# Patient Record
Sex: Male | Born: 1935 | Race: Black or African American | Hispanic: No | Marital: Married | Smoking: Former smoker
Health system: Southern US, Community
[De-identification: ages and names within clinical notes are randomized; demographics above are authoritative.]

## PROBLEM LIST (undated history)

## (undated) DIAGNOSIS — E119 Type 2 diabetes mellitus without complications: Secondary | ICD-10-CM

## (undated) DIAGNOSIS — I10 Essential (primary) hypertension: Secondary | ICD-10-CM

## (undated) DIAGNOSIS — C801 Malignant (primary) neoplasm, unspecified: Secondary | ICD-10-CM

## (undated) DIAGNOSIS — Z87442 Personal history of urinary calculi: Secondary | ICD-10-CM

## (undated) HISTORY — PX: LITHOTRIPSY: SUR834

---

## 2002-06-06 ENCOUNTER — Encounter: Payer: Self-pay | Admitting: Urology

## 2002-06-06 ENCOUNTER — Ambulatory Visit (HOSPITAL_COMMUNITY): Admission: RE | Admit: 2002-06-06 | Discharge: 2002-06-06 | Payer: Self-pay | Admitting: Urology

## 2002-06-11 ENCOUNTER — Ambulatory Visit (HOSPITAL_COMMUNITY): Admission: RE | Admit: 2002-06-11 | Discharge: 2002-06-11 | Payer: Self-pay | Admitting: Urology

## 2002-06-11 ENCOUNTER — Encounter: Payer: Self-pay | Admitting: Urology

## 2003-09-25 ENCOUNTER — Ambulatory Visit (HOSPITAL_COMMUNITY): Admission: RE | Admit: 2003-09-25 | Discharge: 2003-09-25 | Payer: Self-pay

## 2003-10-03 ENCOUNTER — Ambulatory Visit (HOSPITAL_COMMUNITY): Admission: RE | Admit: 2003-10-03 | Discharge: 2003-10-03 | Payer: Self-pay | Admitting: Urology

## 2005-01-19 ENCOUNTER — Ambulatory Visit (HOSPITAL_COMMUNITY): Admission: RE | Admit: 2005-01-19 | Discharge: 2005-01-19 | Payer: Self-pay | Admitting: Internal Medicine

## 2011-06-22 ENCOUNTER — Ambulatory Visit (INDEPENDENT_AMBULATORY_CARE_PROVIDER_SITE_OTHER): Payer: Medicare Other | Admitting: Urology

## 2011-06-22 DIAGNOSIS — N529 Male erectile dysfunction, unspecified: Secondary | ICD-10-CM

## 2011-06-22 DIAGNOSIS — N200 Calculus of kidney: Secondary | ICD-10-CM

## 2011-06-22 DIAGNOSIS — C61 Malignant neoplasm of prostate: Secondary | ICD-10-CM

## 2011-06-22 DIAGNOSIS — N400 Enlarged prostate without lower urinary tract symptoms: Secondary | ICD-10-CM

## 2011-10-15 ENCOUNTER — Other Ambulatory Visit: Payer: Self-pay | Admitting: Urology

## 2011-10-15 ENCOUNTER — Ambulatory Visit (HOSPITAL_COMMUNITY)
Admission: RE | Admit: 2011-10-15 | Discharge: 2011-10-15 | Disposition: A | Discharge status: Home / Self Care | Payer: Medicare Other | Source: Ambulatory Visit | Attending: Urology | Admitting: Urology

## 2011-10-15 DIAGNOSIS — N200 Calculus of kidney: Secondary | ICD-10-CM

## 2011-10-15 DIAGNOSIS — R1032 Left lower quadrant pain: Secondary | ICD-10-CM | POA: Insufficient documentation

## 2011-10-19 ENCOUNTER — Ambulatory Visit (INDEPENDENT_AMBULATORY_CARE_PROVIDER_SITE_OTHER): Payer: Medicare Other | Admitting: Urology

## 2011-10-19 DIAGNOSIS — N200 Calculus of kidney: Secondary | ICD-10-CM

## 2011-10-19 DIAGNOSIS — N529 Male erectile dysfunction, unspecified: Secondary | ICD-10-CM

## 2011-10-19 DIAGNOSIS — C61 Malignant neoplasm of prostate: Secondary | ICD-10-CM

## 2012-04-04 ENCOUNTER — Ambulatory Visit (INDEPENDENT_AMBULATORY_CARE_PROVIDER_SITE_OTHER): Payer: Medicare Other | Admitting: Urology

## 2012-04-04 DIAGNOSIS — C61 Malignant neoplasm of prostate: Secondary | ICD-10-CM

## 2012-06-20 ENCOUNTER — Ambulatory Visit (HOSPITAL_COMMUNITY)
Admission: RE | Admit: 2012-06-20 | Discharge: 2012-06-20 | Disposition: A | Discharge status: Home / Self Care | Payer: Medicare Other | Source: Ambulatory Visit | Attending: Urology | Admitting: Urology

## 2012-06-20 ENCOUNTER — Ambulatory Visit (INDEPENDENT_AMBULATORY_CARE_PROVIDER_SITE_OTHER): Payer: Medicare Other | Admitting: Urology

## 2012-06-20 ENCOUNTER — Other Ambulatory Visit: Payer: Self-pay | Admitting: Urology

## 2012-06-20 DIAGNOSIS — R109 Unspecified abdominal pain: Secondary | ICD-10-CM | POA: Insufficient documentation

## 2012-06-20 DIAGNOSIS — N200 Calculus of kidney: Secondary | ICD-10-CM | POA: Insufficient documentation

## 2012-06-20 DIAGNOSIS — R319 Hematuria, unspecified: Secondary | ICD-10-CM

## 2012-06-20 DIAGNOSIS — C61 Malignant neoplasm of prostate: Secondary | ICD-10-CM

## 2012-07-11 ENCOUNTER — Other Ambulatory Visit: Payer: Self-pay | Admitting: Urology

## 2012-08-01 ENCOUNTER — Ambulatory Visit: Payer: Medicare Other | Admitting: Urology

## 2012-10-10 ENCOUNTER — Other Ambulatory Visit: Payer: Self-pay | Admitting: Urology

## 2012-10-10 ENCOUNTER — Ambulatory Visit (HOSPITAL_COMMUNITY)
Admission: RE | Admit: 2012-10-10 | Discharge: 2012-10-10 | Disposition: A | Discharge status: Home / Self Care | Payer: Medicare Other | Source: Ambulatory Visit | Attending: Urology | Admitting: Urology

## 2012-10-10 DIAGNOSIS — C61 Malignant neoplasm of prostate: Secondary | ICD-10-CM

## 2012-10-10 MED ORDER — GENTAMICIN SULFATE 40 MG/ML IJ SOLN
160.0000 mg | Freq: Once | INTRAMUSCULAR | Status: AC
  Administered 2012-10-10: 160 mg via INTRAMUSCULAR
  Filled 2012-10-10: qty 4

## 2012-10-10 MED ORDER — LIDOCAINE HCL (PF) 2 % IJ SOLN
INTRAMUSCULAR | Status: AC
  Administered 2012-10-10: 10 mL via INTRADERMAL
  Filled 2012-10-10: qty 10

## 2012-10-10 NOTE — Progress Notes (Signed)
Gentamicin 160mg /29mL given IM in left upper arm as ordered by Dr. Retta Diones per protocol. Bandaid applied. No bleeding noted.

## 2012-10-10 NOTE — Progress Notes (Signed)
Lidocaine 2%       10mL injected                       Transrectal prostate biopsies performed 

## 2012-10-16 ENCOUNTER — Encounter: Payer: Self-pay | Admitting: Urology

## 2012-10-17 ENCOUNTER — Ambulatory Visit: Payer: Medicare Other | Admitting: Urology

## 2013-03-20 ENCOUNTER — Encounter (INDEPENDENT_AMBULATORY_CARE_PROVIDER_SITE_OTHER): Payer: Self-pay

## 2013-03-20 ENCOUNTER — Ambulatory Visit (INDEPENDENT_AMBULATORY_CARE_PROVIDER_SITE_OTHER): Payer: Medicare Other | Admitting: Urology

## 2013-03-20 DIAGNOSIS — C61 Malignant neoplasm of prostate: Secondary | ICD-10-CM

## 2013-03-20 DIAGNOSIS — N529 Male erectile dysfunction, unspecified: Secondary | ICD-10-CM

## 2013-09-18 ENCOUNTER — Ambulatory Visit (INDEPENDENT_AMBULATORY_CARE_PROVIDER_SITE_OTHER): Payer: Medicare Other | Admitting: Urology

## 2013-09-18 DIAGNOSIS — N529 Male erectile dysfunction, unspecified: Secondary | ICD-10-CM

## 2013-09-18 DIAGNOSIS — C61 Malignant neoplasm of prostate: Secondary | ICD-10-CM

## 2013-09-18 DIAGNOSIS — N400 Enlarged prostate without lower urinary tract symptoms: Secondary | ICD-10-CM

## 2014-03-19 ENCOUNTER — Ambulatory Visit (INDEPENDENT_AMBULATORY_CARE_PROVIDER_SITE_OTHER): Payer: Medicare Other | Admitting: Urology

## 2014-03-19 DIAGNOSIS — N400 Enlarged prostate without lower urinary tract symptoms: Secondary | ICD-10-CM

## 2014-03-19 DIAGNOSIS — N5201 Erectile dysfunction due to arterial insufficiency: Secondary | ICD-10-CM

## 2014-03-19 DIAGNOSIS — C61 Malignant neoplasm of prostate: Secondary | ICD-10-CM

## 2014-10-15 ENCOUNTER — Ambulatory Visit (INDEPENDENT_AMBULATORY_CARE_PROVIDER_SITE_OTHER): Payer: Medicare Other | Admitting: Urology

## 2014-10-15 DIAGNOSIS — C61 Malignant neoplasm of prostate: Secondary | ICD-10-CM | POA: Diagnosis not present

## 2015-04-08 ENCOUNTER — Ambulatory Visit (INDEPENDENT_AMBULATORY_CARE_PROVIDER_SITE_OTHER): Payer: Medicare Other | Admitting: Urology

## 2015-04-08 DIAGNOSIS — N5201 Erectile dysfunction due to arterial insufficiency: Secondary | ICD-10-CM | POA: Diagnosis not present

## 2015-04-08 DIAGNOSIS — C61 Malignant neoplasm of prostate: Secondary | ICD-10-CM | POA: Diagnosis not present

## 2015-04-08 DIAGNOSIS — N400 Enlarged prostate without lower urinary tract symptoms: Secondary | ICD-10-CM | POA: Diagnosis not present

## 2015-04-15 ENCOUNTER — Other Ambulatory Visit: Payer: Self-pay | Admitting: Urology

## 2015-04-15 ENCOUNTER — Ambulatory Visit (HOSPITAL_COMMUNITY)
Admission: RE | Admit: 2015-04-15 | Discharge: 2015-04-15 | Disposition: A | Discharge status: Home / Self Care | Payer: Medicare Other | Source: Ambulatory Visit | Attending: Urology | Admitting: Urology

## 2015-04-15 DIAGNOSIS — K5900 Constipation, unspecified: Secondary | ICD-10-CM | POA: Diagnosis not present

## 2015-04-15 DIAGNOSIS — N200 Calculus of kidney: Secondary | ICD-10-CM | POA: Diagnosis not present

## 2015-10-07 ENCOUNTER — Ambulatory Visit (INDEPENDENT_AMBULATORY_CARE_PROVIDER_SITE_OTHER): Payer: Medicare Other | Admitting: Urology

## 2015-10-07 DIAGNOSIS — C61 Malignant neoplasm of prostate: Secondary | ICD-10-CM

## 2015-10-07 DIAGNOSIS — R310 Gross hematuria: Secondary | ICD-10-CM | POA: Diagnosis not present

## 2015-10-08 ENCOUNTER — Other Ambulatory Visit: Payer: Self-pay | Admitting: Urology

## 2015-10-08 DIAGNOSIS — R310 Gross hematuria: Secondary | ICD-10-CM

## 2015-10-24 ENCOUNTER — Ambulatory Visit (HOSPITAL_COMMUNITY)
Admission: RE | Admit: 2015-10-24 | Discharge: 2015-10-24 | Disposition: A | Discharge status: Home / Self Care | Payer: Medicare Other | Source: Ambulatory Visit | Attending: Urology | Admitting: Urology

## 2015-10-24 DIAGNOSIS — N200 Calculus of kidney: Secondary | ICD-10-CM | POA: Diagnosis not present

## 2015-10-24 DIAGNOSIS — N281 Cyst of kidney, acquired: Secondary | ICD-10-CM | POA: Diagnosis not present

## 2015-10-24 DIAGNOSIS — N400 Enlarged prostate without lower urinary tract symptoms: Secondary | ICD-10-CM | POA: Diagnosis not present

## 2015-10-24 DIAGNOSIS — R310 Gross hematuria: Secondary | ICD-10-CM

## 2015-10-24 DIAGNOSIS — C61 Malignant neoplasm of prostate: Secondary | ICD-10-CM | POA: Insufficient documentation

## 2016-02-03 ENCOUNTER — Ambulatory Visit (INDEPENDENT_AMBULATORY_CARE_PROVIDER_SITE_OTHER): Payer: Medicare Other | Admitting: Urology

## 2016-02-03 DIAGNOSIS — C61 Malignant neoplasm of prostate: Secondary | ICD-10-CM | POA: Diagnosis not present

## 2016-02-03 DIAGNOSIS — N201 Calculus of ureter: Secondary | ICD-10-CM | POA: Diagnosis not present

## 2016-04-13 ENCOUNTER — Ambulatory Visit (INDEPENDENT_AMBULATORY_CARE_PROVIDER_SITE_OTHER): Payer: Medicare Other | Admitting: Urology

## 2016-04-13 ENCOUNTER — Other Ambulatory Visit (HOSPITAL_COMMUNITY)
Admission: RE | Admit: 2016-04-13 | Discharge: 2016-04-13 | Disposition: A | Discharge status: Home / Self Care | Payer: Medicare Other | Source: Ambulatory Visit | Attending: Urology | Admitting: Urology

## 2016-04-13 DIAGNOSIS — N3001 Acute cystitis with hematuria: Secondary | ICD-10-CM | POA: Diagnosis not present

## 2016-04-13 DIAGNOSIS — R310 Gross hematuria: Secondary | ICD-10-CM | POA: Diagnosis not present

## 2016-04-18 LAB — URINE CULTURE: Culture: >=100000 — AB

## 2016-06-01 ENCOUNTER — Other Ambulatory Visit: Payer: Self-pay | Admitting: Urology

## 2016-06-01 ENCOUNTER — Ambulatory Visit (HOSPITAL_COMMUNITY)
Admission: RE | Admit: 2016-06-01 | Discharge: 2016-06-01 | Disposition: A | Discharge status: Home / Self Care | Payer: Medicare Other | Source: Ambulatory Visit | Attending: Urology | Admitting: Urology

## 2016-06-01 ENCOUNTER — Other Ambulatory Visit (HOSPITAL_COMMUNITY)
Admission: RE | Admit: 2016-06-01 | Discharge: 2016-06-01 | Disposition: A | Discharge status: Home / Self Care | Payer: Medicare Other | Attending: Urology | Admitting: Urology

## 2016-06-01 ENCOUNTER — Ambulatory Visit (INDEPENDENT_AMBULATORY_CARE_PROVIDER_SITE_OTHER): Payer: Medicare Other | Admitting: Urology

## 2016-06-01 DIAGNOSIS — N200 Calculus of kidney: Secondary | ICD-10-CM | POA: Diagnosis not present

## 2016-06-01 DIAGNOSIS — C61 Malignant neoplasm of prostate: Secondary | ICD-10-CM

## 2016-06-03 LAB — URINE CULTURE: Culture: <10000 — AB

## 2016-10-01 ENCOUNTER — Ambulatory Visit (INDEPENDENT_AMBULATORY_CARE_PROVIDER_SITE_OTHER): Payer: Medicare Other | Admitting: Urology

## 2016-10-01 ENCOUNTER — Other Ambulatory Visit (HOSPITAL_COMMUNITY)
Admission: RE | Admit: 2016-10-01 | Discharge: 2016-10-01 | Disposition: A | Discharge status: Home / Self Care | Payer: Medicare Other | Source: Other Acute Inpatient Hospital | Attending: Urology | Admitting: Urology

## 2016-10-01 DIAGNOSIS — N200 Calculus of kidney: Secondary | ICD-10-CM | POA: Diagnosis not present

## 2016-10-01 DIAGNOSIS — R310 Gross hematuria: Secondary | ICD-10-CM | POA: Diagnosis present

## 2016-10-01 DIAGNOSIS — N3001 Acute cystitis with hematuria: Secondary | ICD-10-CM

## 2016-10-01 LAB — URINALYSIS, ROUTINE W REFLEX MICROSCOPIC

## 2016-10-01 LAB — URINALYSIS, MICROSCOPIC (REFLEX)

## 2016-10-05 LAB — URINE CULTURE: Culture: 50000 — AB

## 2016-11-30 ENCOUNTER — Other Ambulatory Visit (HOSPITAL_COMMUNITY)
Admission: AD | Admit: 2016-11-30 | Discharge: 2016-11-30 | Disposition: A | Discharge status: Home / Self Care | Payer: Medicare Other | Source: Skilled Nursing Facility | Attending: Urology | Admitting: Urology

## 2016-11-30 ENCOUNTER — Ambulatory Visit (INDEPENDENT_AMBULATORY_CARE_PROVIDER_SITE_OTHER): Payer: Medicare Other | Admitting: Urology

## 2016-11-30 ENCOUNTER — Ambulatory Visit (HOSPITAL_COMMUNITY)
Admission: RE | Admit: 2016-11-30 | Discharge: 2016-11-30 | Disposition: A | Discharge status: Home / Self Care | Payer: Medicare Other | Source: Ambulatory Visit | Attending: Urology | Admitting: Urology

## 2016-11-30 ENCOUNTER — Other Ambulatory Visit: Payer: Self-pay | Admitting: Urology

## 2016-11-30 DIAGNOSIS — N200 Calculus of kidney: Secondary | ICD-10-CM

## 2016-11-30 DIAGNOSIS — N5201 Erectile dysfunction due to arterial insufficiency: Secondary | ICD-10-CM

## 2016-11-30 DIAGNOSIS — C61 Malignant neoplasm of prostate: Secondary | ICD-10-CM

## 2016-11-30 DIAGNOSIS — N400 Enlarged prostate without lower urinary tract symptoms: Secondary | ICD-10-CM

## 2016-12-02 LAB — URINE CULTURE

## 2017-02-14 NOTE — Patient Instructions (Addendum)
Your procedure is scheduled on: 02/21/2017  Report to [facility] at  53  AM.  Call this number if you have problems the morning of surgery: [phone]   Do not eat food or drink liquids :After Midnight.      Take these medicines the morning of surgery with A SIP OF WATER: losartan and procardia. DO NOT take any medications for diabetes the morning of surgery.   Do not wear jewelry, make-up or nail polish.  Do not wear lotions, powders, or perfumes. You may wear deodorant.  Do not shave 48 hours prior to surgery.  Do not bring valuables to the hospital.  Contacts, dentures or bridgework may not be worn into surgery.  Leave suitcase in the car. After surgery it may be brought to your room.  For patients admitted to the hospital, checkout time is 11:00 AM the day of discharge.   Patients discharged the day of surgery will not be allowed to drive home.  :     Please read over the following fact sheets that you were given: Coughing and Deep Breathing, Surgical Site Infection Prevention, Anesthesia Post-op Instructions and Care and Recovery After Surgery    Cataract A cataract is a clouding of the lens of the eye. When a lens becomes cloudy, vision is reduced based on the degree and nature of the clouding. Many cataracts reduce vision to some degree. Some cataracts make people more near-sighted as they develop. Other cataracts increase glare. Cataracts that are ignored and become worse can sometimes look white. The white color can be seen through the pupil. CAUSES   Aging. However, cataracts may occur at any age, even in newborns.   Certain drugs.   Trauma to the eye.   Certain diseases such as diabetes.   Specific eye diseases such as chronic inflammation inside the eye or a sudden attack of a rare form of glaucoma.   Inherited or acquired medical problems.  SYMPTOMS   Gradual, progressive drop in vision in the affected eye.   Severe, rapid visual loss. This most often happens  when trauma is the cause.  DIAGNOSIS  To detect a cataract, an eye doctor examines the lens. Cataracts are best diagnosed with an exam of the eyes with the pupils enlarged (dilated) by drops.  TREATMENT  For an early cataract, vision may improve by using different eyeglasses or stronger lighting. If that does not help your vision, surgery is the only effective treatment. A cataract needs to be surgically removed when vision loss interferes with your everyday activities, such as driving, reading, or watching TV. A cataract may also have to be removed if it prevents examination or treatment of another eye problem. Surgery removes the cloudy lens and usually replaces it with a substitute lens (intraocular lens, IOL).  At a time when both you and your doctor agree, the cataract will be surgically removed. If you have cataracts in both eyes, only one is usually removed at a time. This allows the operated eye to heal and be out of danger from any possible problems after surgery (such as infection or poor wound healing). In rare cases, a cataract may be doing damage to your eye. In these cases, your caregiver may advise surgical removal right away. The vast majority of people who have cataract surgery have better vision afterward. HOME CARE INSTRUCTIONS  If you are not planning surgery, you may be asked to do the following:  Use different eyeglasses.   Use stronger or brighter  lighting.   Ask your eye doctor about reducing your medicine dose or changing medicines if it is thought that a medicine caused your cataract. Changing medicines does not make the cataract go away on its own.   Become familiar with your surroundings. Poor vision can lead to injury. Avoid bumping into things on the affected side. You are at a higher risk for tripping or falling.   Exercise extreme care when driving or operating machinery.   Wear sunglasses if you are sensitive to bright light or experiencing problems with glare.    SEEK IMMEDIATE MEDICAL CARE IF:   You have a worsening or sudden vision loss.   You notice redness, swelling, or increasing pain in the eye.   You have a fever.  Document Released: 05/03/2005 Document Revised: 04/22/2011 Document Reviewed: 12/25/2010 [facility] Patient Information 2012 [place].PATIENT INSTRUCTIONS POST-ANESTHESIA  IMMEDIATELY FOLLOWING SURGERY:  Do not drive or operate machinery for the first twenty four hours after surgery.  Do not make any important decisions for twenty four hours after surgery or while taking narcotic pain medications or sedatives.  If you develop intractable nausea and vomiting or a severe headache please notify your doctor immediately.  FOLLOW-UP:  Please make an appointment with your surgeon as instructed. You do not need to follow up with anesthesia unless specifically instructed to do so.  WOUND CARE INSTRUCTIONS (if applicable):  Keep a dry clean dressing on the anesthesia/puncture wound site if there is drainage.  Once the wound has quit draining you may leave it open to air.  Generally you should leave the bandage intact for twenty four hours unless there is drainage.  If the epidural site drains for more than 36-48 hours please call the anesthesia department.  QUESTIONS?:  Please feel free to call your physician or the hospital operator if you have any questions, and they will be happy to assist you.

## 2017-02-16 ENCOUNTER — Encounter (HOSPITAL_COMMUNITY)
Admission: RE | Admit: 2017-02-16 | Discharge: 2017-02-16 | Disposition: A | Discharge status: Home / Self Care | Payer: Medicare Other | Source: Ambulatory Visit | Attending: Ophthalmology | Admitting: Ophthalmology

## 2017-02-16 ENCOUNTER — Encounter (HOSPITAL_COMMUNITY): Payer: Self-pay

## 2017-02-16 DIAGNOSIS — Z01812 Encounter for preprocedural laboratory examination: Secondary | ICD-10-CM | POA: Diagnosis present

## 2017-02-16 DIAGNOSIS — Z01810 Encounter for preprocedural cardiovascular examination: Secondary | ICD-10-CM | POA: Diagnosis present

## 2017-02-16 HISTORY — DX: Essential (primary) hypertension: I10

## 2017-02-16 HISTORY — DX: Personal history of urinary calculi: Z87.442

## 2017-02-16 HISTORY — DX: Malignant (primary) neoplasm, unspecified: C80.1

## 2017-02-16 HISTORY — DX: Type 2 diabetes mellitus without complications: E11.9

## 2017-02-16 LAB — BASIC METABOLIC PANEL
Anion gap: 7 (ref 5–15)
BUN: 13 mg/dL (ref 6–20)
CO2: 26 mmol/L (ref 22–32)
CREATININE: 0.91 mg/dL (ref 0.61–1.24)
Calcium: 9.1 mg/dL (ref 8.9–10.3)
Chloride: 102 mmol/L (ref 101–111)
GFR calc Af Amer: >60 mL/min (ref >60)
GLUCOSE: 212 mg/dL — AB (ref 65–99)
POTASSIUM: 3.9 mmol/L (ref 3.5–5.1)
Sodium: 135 mmol/L (ref 135–145)

## 2017-02-16 LAB — CBC WITH DIFFERENTIAL/PLATELET
Basophils Absolute: 0 10*3/uL (ref 0.0–0.1)
Basophils Relative: 1 %
EOS ABS: 0.2 10*3/uL (ref 0.0–0.7)
EOS PCT: 3 %
HCT: 38.5 % — ABNORMAL LOW (ref 39.0–52.0)
Hemoglobin: 13.3 g/dL (ref 13.0–17.0)
LYMPHS ABS: 1.7 10*3/uL (ref 0.7–4.0)
LYMPHS PCT: 27 %
MCH: 25.2 pg — AB (ref 26.0–34.0)
MCHC: 34.5 g/dL (ref 30.0–36.0)
MCV: 73.1 fL — AB (ref 78.0–100.0)
MONO ABS: 0.5 10*3/uL (ref 0.1–1.0)
MONOS PCT: 8 %
Neutro Abs: 3.7 10*3/uL (ref 1.7–7.7)
Neutrophils Relative %: 61 %
PLATELETS: 238 10*3/uL (ref 150–400)
RBC: 5.27 MIL/uL (ref 4.22–5.81)
RDW: 15.4 % (ref 11.5–15.5)
WBC: 6 10*3/uL (ref 4.0–10.5)

## 2017-02-21 ENCOUNTER — Encounter (HOSPITAL_COMMUNITY): Payer: Self-pay

## 2017-02-21 ENCOUNTER — Ambulatory Visit (HOSPITAL_COMMUNITY): Payer: Medicare Other | Admitting: Anesthesiology

## 2017-02-21 ENCOUNTER — Ambulatory Visit (HOSPITAL_COMMUNITY)
Admission: RE | Admit: 2017-02-21 | Discharge: 2017-02-21 | Disposition: A | Discharge status: Home / Self Care | Payer: Medicare Other | Source: Ambulatory Visit | Attending: Ophthalmology | Admitting: Ophthalmology

## 2017-02-21 ENCOUNTER — Encounter (HOSPITAL_COMMUNITY)
Admission: RE | Disposition: A | Discharge status: Home / Self Care | Payer: Self-pay | Source: Ambulatory Visit | Attending: Ophthalmology

## 2017-02-21 DIAGNOSIS — H2511 Age-related nuclear cataract, right eye: Secondary | ICD-10-CM | POA: Diagnosis not present

## 2017-02-21 DIAGNOSIS — Z7984 Long term (current) use of oral hypoglycemic drugs: Secondary | ICD-10-CM | POA: Insufficient documentation

## 2017-02-21 DIAGNOSIS — Z7982 Long term (current) use of aspirin: Secondary | ICD-10-CM | POA: Diagnosis not present

## 2017-02-21 DIAGNOSIS — Z87891 Personal history of nicotine dependence: Secondary | ICD-10-CM | POA: Insufficient documentation

## 2017-02-21 DIAGNOSIS — I10 Essential (primary) hypertension: Secondary | ICD-10-CM | POA: Insufficient documentation

## 2017-02-21 DIAGNOSIS — Z79899 Other long term (current) drug therapy: Secondary | ICD-10-CM | POA: Insufficient documentation

## 2017-02-21 DIAGNOSIS — E119 Type 2 diabetes mellitus without complications: Secondary | ICD-10-CM | POA: Insufficient documentation

## 2017-02-21 HISTORY — PX: CATARACT EXTRACTION W/PHACO: SHX586

## 2017-02-21 LAB — GLUCOSE, CAPILLARY: Glucose-Capillary: 117 mg/dL — ABNORMAL HIGH (ref 65–99)

## 2017-02-21 SURGERY — PHACOEMULSIFICATION, CATARACT, WITH IOL INSERTION
Anesthesia: Monitor Anesthesia Care | Site: Eye | Laterality: Right

## 2017-02-21 MED ORDER — NEOMYCIN-POLYMYXIN-DEXAMETH 3.5-10000-0.1 OP SUSP
OPHTHALMIC | Status: DC | PRN
  Administered 2017-02-21: 2 [drp] via OPHTHALMIC

## 2017-02-21 MED ORDER — BSS IO SOLN
INTRAOCULAR | Status: DC | PRN
  Administered 2017-02-21: 15 mL

## 2017-02-21 MED ORDER — FENTANYL CITRATE (PF) 100 MCG/2ML IJ SOLN
25.0000 ug | Freq: Once | INTRAMUSCULAR | Status: AC
  Administered 2017-02-21: 25 ug via INTRAVENOUS

## 2017-02-21 MED ORDER — LIDOCAINE HCL (PF) 1 % IJ SOLN
INTRAMUSCULAR | Status: DC | PRN
  Administered 2017-02-21: .5 mL

## 2017-02-21 MED ORDER — CYCLOPENTOLATE-PHENYLEPHRINE 0.2-1 % OP SOLN
1.0000 [drp] | OPHTHALMIC | Status: AC
  Administered 2017-02-21 (×3): 1 [drp] via OPHTHALMIC

## 2017-02-21 MED ORDER — LIDOCAINE HCL 3.5 % OP GEL
1.0000 "application " | Freq: Once | OPHTHALMIC | Status: AC
  Administered 2017-02-21: 1 via OPHTHALMIC

## 2017-02-21 MED ORDER — FENTANYL CITRATE (PF) 100 MCG/2ML IJ SOLN
INTRAMUSCULAR | Status: AC
  Filled 2017-02-21: qty 2

## 2017-02-21 MED ORDER — MIDAZOLAM HCL 2 MG/2ML IJ SOLN
INTRAMUSCULAR | Status: AC
  Filled 2017-02-21: qty 2

## 2017-02-21 MED ORDER — BSS IO SOLN
INTRAOCULAR | Status: DC | PRN
  Administered 2017-02-21: 500 mL

## 2017-02-21 MED ORDER — LACTATED RINGERS IV SOLN
INTRAVENOUS | Status: DC
  Administered 2017-02-21: 07:00:00 via INTRAVENOUS

## 2017-02-21 MED ORDER — PROVISC 10 MG/ML IO SOLN
INTRAOCULAR | Status: DC | PRN
Start: 2017-02-21 — End: 2017-02-21
  Administered 2017-02-21: 0.85 mL via INTRAOCULAR

## 2017-02-21 MED ORDER — MIDAZOLAM HCL 2 MG/2ML IJ SOLN
1.0000 mg | INTRAMUSCULAR | Status: AC
  Administered 2017-02-21: 2 mg via INTRAVENOUS

## 2017-02-21 MED ORDER — POVIDONE-IODINE 5 % OP SOLN
OPHTHALMIC | Status: DC | PRN
  Administered 2017-02-21: 1 via OPHTHALMIC

## 2017-02-21 MED ORDER — TETRACAINE HCL 0.5 % OP SOLN
1.0000 [drp] | OPHTHALMIC | Status: AC
  Administered 2017-02-21 (×3): 1 [drp] via OPHTHALMIC

## 2017-02-21 MED ORDER — PHENYLEPHRINE HCL 2.5 % OP SOLN
1.0000 [drp] | OPHTHALMIC | Status: AC
  Administered 2017-02-21 (×3): 1 [drp] via OPHTHALMIC

## 2017-02-21 SURGICAL SUPPLY — 12 items
CLOTH BEACON ORANGE TIMEOUT ST (SAFETY) ×3 IMPLANT
EYE SHIELD UNIVERSAL CLEAR (GAUZE/BANDAGES/DRESSINGS) ×3 IMPLANT
GLOVE BIOGEL PI IND STRL 6.5 (GLOVE) ×1 IMPLANT
GLOVE BIOGEL PI IND STRL 7.0 (GLOVE) ×1 IMPLANT
GLOVE BIOGEL PI INDICATOR 6.5 (GLOVE) ×2
GLOVE BIOGEL PI INDICATOR 7.0 (GLOVE) ×2
LENS ALC ACRYL/TECN (Ophthalmic Related) ×3 IMPLANT
PAD ARMBOARD 7.5X6 YLW CONV (MISCELLANEOUS) ×3 IMPLANT
SYRINGE LUER LOK 1CC (MISCELLANEOUS) ×3 IMPLANT
TAPE SURG TRANSPORE 1 IN (GAUZE/BANDAGES/DRESSINGS) ×1 IMPLANT
TAPE SURGICAL TRANSPORE 1 IN (GAUZE/BANDAGES/DRESSINGS) ×2
WATER STERILE IRR 250ML POUR (IV SOLUTION) ×3 IMPLANT

## 2017-02-21 NOTE — Op Note (Signed)
Date of Admission: 02/21/2017  Date of Surgery: 02/21/2017  Pre-Op Dx: Cataract Right  Eye  Post-Op Dx: Senile Nuclear Cataract  Right  Eye,  Dx Code H25.11  Surgeon: Tonny Branch, M.D.  Assistants: None  Anesthesia: Topical with MAC  Indications: Painless, progressive loss of vision with compromise of daily activities.  Surgery: Cataract Extraction with Intraocular lens Implant Right Eye  Discription: The patient had dilating drops and viscous lidocaine placed into the Right eye in the pre-op holding area. After transfer to the operating room, a time out was performed. The patient was then prepped and draped. Beginning with a 20m blade a paracentesis port was made at the surgeon's 2 o'clock position. The anterior chamber was then filled with 1% non-preserved lidocaine. This was followed by filling the anterior chamber with Provisc.  A 2.429mkeratome blade was used to make a clear corneal incision at the temporal limbus.  A bent cystatome needle was used to create a continuous tear capsulotomy. Hydrodissection was performed with balanced salt solution on a Fine canula. The lens nucleus was then removed using the phacoemulsification handpiece. Residual cortex was removed with the I&A handpiece. The anterior chamber and capsular bag were refilled with Provisc. A posterior chamber intraocular lens was placed into the capsular bag with it's injector. The implant was positioned with the Kuglan hook. The Provisc was then removed from the anterior chamber and capsular bag with the I&A handpiece. Stromal hydration of the main incision and paracentesis port was performed with BSS on a Fine canula. The wounds were tested for leak which was negative. The patient tolerated the procedure well. There were no operative complications. The patient was then transferred to the recovery room in stable condition.  Complications: None  Specimen: None  EBL: None  Prosthetic device: Abbott Technis, PCB00, power 19.5,  SN [id number]

## 2017-02-21 NOTE — Transfer of Care (Signed)
Immediate Anesthesia Transfer of Care Note  Patient: Alexander Frank.  Procedure(s) Performed: CATARACT EXTRACTION PHACO AND INTRAOCULAR LENS PLACEMENT (IOC) (Right Eye)  Patient Location: PACU  Anesthesia Type:MAC  Level of Consciousness: awake, alert , oriented and patient cooperative  Airway & Oxygen Therapy: Patient Spontanous Breathing  Post-op Assessment: Report given to RN and Post -op Vital signs reviewed and stable  Post vital signs: Reviewed and stable  Last Vitals:  Vitals:   02/21/17 0710 02/21/17 0715  BP: (!) 142/74 121/69  Resp: 15 13  Temp:    SpO2: 94% 98%    Last Pain:  Vitals:   02/21/17 0640  TempSrc: Oral         Complications: No apparent anesthesia complications

## 2017-02-21 NOTE — Anesthesia Preprocedure Evaluation (Signed)
Anesthesia Evaluation  Patient identified by MRN, date of birth, ID band Patient awake    Reviewed: Allergy & Precautions, NPO status , Patient's Chart, lab work & pertinent test results  Airway Mallampati: III  TM Distance: >3 FB Neck ROM: Full    Dental  (+) Poor Dentition, Missing   Pulmonary former smoker,    breath sounds clear to auscultation       Cardiovascular hypertension, Pt. on medications  Rhythm:Regular Rate:Normal     Neuro/Psych    GI/Hepatic   Endo/Other  diabetes, Type 2, Oral Hypoglycemic Agents  Renal/GU      Musculoskeletal   Abdominal   Peds  Hematology   Anesthesia Other Findings   Reproductive/Obstetrics                             Anesthesia Physical Anesthesia Plan  ASA: III  Anesthesia Plan: MAC   Post-op Pain Management:    Induction: Intravenous  PONV Risk Score and Plan:   Airway Management Planned: Nasal Cannula  Additional Equipment:   Intra-op Plan:   Post-operative Plan:   Informed Consent: I have reviewed the patients History and Physical, chart, labs and discussed the procedure including the risks, benefits and alternatives for the proposed anesthesia with the patient or authorized representative who has indicated his/her understanding and acceptance.     Plan Discussed with:   Anesthesia Plan Comments:         Anesthesia Quick Evaluation

## 2017-02-21 NOTE — H&P (Signed)
I have reviewed the H&P, the patient was re-examined, and I have identified no interval changes in medical condition and plan of care since the history and physical of record  

## 2017-02-21 NOTE — Discharge Instructions (Signed)

## 2017-02-21 NOTE — Anesthesia Postprocedure Evaluation (Signed)
Anesthesia Post Note  Patient: Alexander Frank.  Procedure(s) Performed: CATARACT EXTRACTION PHACO AND INTRAOCULAR LENS PLACEMENT (IOC) (Right Eye)  Patient location during evaluation: PACU Anesthesia Type: MAC Level of consciousness: awake, awake and alert, oriented and patient cooperative Pain management: pain level controlled Vital Signs Assessment: post-procedure vital signs reviewed and stable Respiratory status: spontaneous breathing, nonlabored ventilation and respiratory function stable Cardiovascular status: stable Postop Assessment: no apparent nausea or vomiting Anesthetic complications: no     Last Vitals:  Vitals:   02/21/17 0710 02/21/17 0715  BP: (!) 142/74 121/69  Resp: 15 13  Temp:    SpO2: 94% 98%    Last Pain:  Vitals:   02/21/17 0640  TempSrc: Oral                 Ky Moskowitz L

## 2017-02-22 ENCOUNTER — Encounter (HOSPITAL_COMMUNITY): Payer: Self-pay | Admitting: Ophthalmology

## 2017-03-10 ENCOUNTER — Other Ambulatory Visit (HOSPITAL_COMMUNITY): Payer: Self-pay | Admitting: Internal Medicine

## 2017-03-10 ENCOUNTER — Ambulatory Visit (HOSPITAL_COMMUNITY)
Admission: RE | Admit: 2017-03-10 | Discharge: 2017-03-10 | Disposition: A | Discharge status: Home / Self Care | Payer: Medicare Other | Source: Ambulatory Visit | Attending: Internal Medicine | Admitting: Internal Medicine

## 2017-03-10 DIAGNOSIS — N200 Calculus of kidney: Secondary | ICD-10-CM | POA: Diagnosis not present

## 2017-03-10 DIAGNOSIS — M2578 Osteophyte, vertebrae: Secondary | ICD-10-CM | POA: Diagnosis not present

## 2017-03-10 DIAGNOSIS — M545 Low back pain: Secondary | ICD-10-CM

## 2017-03-10 DIAGNOSIS — M549 Dorsalgia, unspecified: Secondary | ICD-10-CM | POA: Diagnosis not present

## 2017-03-22 ENCOUNTER — Encounter (HOSPITAL_COMMUNITY)
Admission: RE | Admit: 2017-03-22 | Discharge: 2017-03-22 | Disposition: A | Discharge status: Home / Self Care | Payer: Medicare Other | Source: Ambulatory Visit | Attending: Ophthalmology | Admitting: Ophthalmology

## 2017-03-22 ENCOUNTER — Encounter (HOSPITAL_COMMUNITY): Payer: Self-pay

## 2017-03-28 ENCOUNTER — Ambulatory Visit (HOSPITAL_COMMUNITY): Payer: Medicare Other | Admitting: Anesthesiology

## 2017-03-28 ENCOUNTER — Encounter (HOSPITAL_COMMUNITY)
Admission: RE | Disposition: A | Discharge status: Home / Self Care | Payer: Self-pay | Source: Ambulatory Visit | Attending: Ophthalmology

## 2017-03-28 ENCOUNTER — Encounter (HOSPITAL_COMMUNITY): Payer: Self-pay | Admitting: Ophthalmology

## 2017-03-28 ENCOUNTER — Ambulatory Visit (HOSPITAL_COMMUNITY)
Admission: RE | Admit: 2017-03-28 | Discharge: 2017-03-28 | Disposition: A | Discharge status: Home / Self Care | Payer: Medicare Other | Source: Ambulatory Visit | Attending: Ophthalmology | Admitting: Ophthalmology

## 2017-03-28 DIAGNOSIS — Z79891 Long term (current) use of opiate analgesic: Secondary | ICD-10-CM | POA: Insufficient documentation

## 2017-03-28 DIAGNOSIS — Z87891 Personal history of nicotine dependence: Secondary | ICD-10-CM | POA: Diagnosis not present

## 2017-03-28 DIAGNOSIS — I10 Essential (primary) hypertension: Secondary | ICD-10-CM | POA: Diagnosis not present

## 2017-03-28 DIAGNOSIS — Z79899 Other long term (current) drug therapy: Secondary | ICD-10-CM | POA: Diagnosis not present

## 2017-03-28 DIAGNOSIS — E119 Type 2 diabetes mellitus without complications: Secondary | ICD-10-CM | POA: Insufficient documentation

## 2017-03-28 DIAGNOSIS — Z7984 Long term (current) use of oral hypoglycemic drugs: Secondary | ICD-10-CM | POA: Insufficient documentation

## 2017-03-28 DIAGNOSIS — H2512 Age-related nuclear cataract, left eye: Secondary | ICD-10-CM | POA: Diagnosis present

## 2017-03-28 HISTORY — PX: CATARACT EXTRACTION W/PHACO: SHX586

## 2017-03-28 LAB — GLUCOSE, CAPILLARY: Glucose-Capillary: 127 mg/dL — ABNORMAL HIGH (ref 65–99)

## 2017-03-28 SURGERY — PHACOEMULSIFICATION, CATARACT, WITH IOL INSERTION
Anesthesia: Monitor Anesthesia Care | Site: Eye | Laterality: Left

## 2017-03-28 MED ORDER — LIDOCAINE HCL 3.5 % OP GEL
1.0000 "application " | Freq: Once | OPHTHALMIC | Status: AC
  Administered 2017-03-28: 1 via OPHTHALMIC

## 2017-03-28 MED ORDER — NEOMYCIN-POLYMYXIN-DEXAMETH 3.5-10000-0.1 OP SUSP
OPHTHALMIC | Status: DC | PRN
  Administered 2017-03-28: 2 [drp] via OPHTHALMIC

## 2017-03-28 MED ORDER — CYCLOPENTOLATE-PHENYLEPHRINE 0.2-1 % OP SOLN
1.0000 [drp] | OPHTHALMIC | Status: AC
Start: 2017-03-28 — End: 2017-03-28
  Administered 2017-03-28 (×3): 1 [drp] via OPHTHALMIC

## 2017-03-28 MED ORDER — MIDAZOLAM HCL 2 MG/2ML IJ SOLN
INTRAMUSCULAR | Status: AC
  Filled 2017-03-28: qty 2

## 2017-03-28 MED ORDER — TETRACAINE HCL 0.5 % OP SOLN
1.0000 [drp] | OPHTHALMIC | Status: AC
  Administered 2017-03-28 (×3): 1 [drp] via OPHTHALMIC

## 2017-03-28 MED ORDER — PHENYLEPHRINE HCL 2.5 % OP SOLN
1.0000 [drp] | OPHTHALMIC | Status: AC
  Administered 2017-03-28 (×3): 1 [drp] via OPHTHALMIC

## 2017-03-28 MED ORDER — LACTATED RINGERS IV SOLN
INTRAVENOUS | Status: DC
  Administered 2017-03-28: 08:00:00 via INTRAVENOUS

## 2017-03-28 MED ORDER — MIDAZOLAM HCL 2 MG/2ML IJ SOLN
1.0000 mg | INTRAMUSCULAR | Status: AC
  Administered 2017-03-28: 2 mg via INTRAVENOUS

## 2017-03-28 MED ORDER — POVIDONE-IODINE 5 % OP SOLN
OPHTHALMIC | Status: DC | PRN
  Administered 2017-03-28: 1 via OPHTHALMIC

## 2017-03-28 MED ORDER — BSS IO SOLN
INTRAOCULAR | Status: DC | PRN
  Administered 2017-03-28: 15 mL

## 2017-03-28 MED ORDER — LIDOCAINE HCL (PF) 1 % IJ SOLN
INTRAMUSCULAR | Status: DC | PRN
Start: 2017-03-28 — End: 2017-03-28
  Administered 2017-03-28: .4 mL

## 2017-03-28 MED ORDER — FENTANYL CITRATE (PF) 100 MCG/2ML IJ SOLN
25.0000 ug | Freq: Once | INTRAMUSCULAR | Status: AC
  Administered 2017-03-28: 25 ug via INTRAVENOUS

## 2017-03-28 MED ORDER — BSS IO SOLN
INTRAOCULAR | Status: DC | PRN
  Administered 2017-03-28: 500 mL

## 2017-03-28 MED ORDER — PROVISC 10 MG/ML IO SOLN
INTRAOCULAR | Status: DC | PRN
  Administered 2017-03-28: 0.85 mL via INTRAOCULAR

## 2017-03-28 MED ORDER — FENTANYL CITRATE (PF) 100 MCG/2ML IJ SOLN
INTRAMUSCULAR | Status: AC
  Filled 2017-03-28: qty 2

## 2017-03-28 SURGICAL SUPPLY — 10 items

## 2017-03-28 NOTE — Op Note (Signed)
Date of Admission: 03/28/2017  Date of Surgery: 03/28/2017  Pre-Op Dx: Cataract Left  Eye  Post-Op Dx: Senile Nuclear Cataract  Left  Eye,  Dx Code H25.12  Surgeon: Tonny Branch, M.D.  Assistants: None  Anesthesia: Topical with MAC  Indications: Painless, progressive loss of vision with compromise of daily activities.  Surgery: Cataract Extraction with Intraocular lens Implant Left Eye  Discription: The patient had dilating drops and viscous lidocaine placed into the Left eye in the pre-op holding area. After transfer to the operating room, a time out was performed. The patient was then prepped and draped. Beginning with a 31m blade a paracentesis port was made at the surgeon's 2 o'clock position. The anterior chamber was then filled with 1% non-preserved lidocaine. This was followed by filling the anterior chamber with Provisc.  A 2.468mkeratome blade was used to make a clear corneal incision at the temporal limbus.  A bent cystatome needle was used to create a continuous tear capsulotomy. Hydrodissection was performed with balanced salt solution on a Fine canula. The lens nucleus was then removed using the phacoemulsification handpiece. Residual cortex was removed with the I&A handpiece. The anterior chamber and capsular bag were refilled with Provisc. A posterior chamber intraocular lens was placed into the capsular bag with it's injector. The implant was positioned with the Kuglan hook. The Provisc was then removed from the anterior chamber and capsular bag with the I&A handpiece. Stromal hydration of the main incision and paracentesis port was performed with BSS on a Fine canula. The wounds were tested for leak which was negative. The patient tolerated the procedure well. There were no operative complications. The patient was then transferred to the recovery room in stable condition.  Complications: None  Specimen: None  EBL: None  Prosthetic device: Abbott Technis, PCB00, power 22.0, SN  [id number]

## 2017-03-28 NOTE — Anesthesia Preprocedure Evaluation (Signed)
Anesthesia Evaluation  Patient identified by MRN, date of birth, ID band Patient awake    Reviewed: Allergy & Precautions, NPO status , Patient's Chart, lab work & pertinent test results  Airway Mallampati: III  TM Distance: >3 FB Neck ROM: Full    Dental  (+) Poor Dentition, Missing   Pulmonary former smoker,    breath sounds clear to auscultation       Cardiovascular hypertension, Pt. on medications  Rhythm:Regular Rate:Normal     Neuro/Psych    GI/Hepatic   Endo/Other  diabetes, Type 2, Oral Hypoglycemic Agents  Renal/GU      Musculoskeletal   Abdominal   Peds  Hematology   Anesthesia Other Findings   Reproductive/Obstetrics                             Anesthesia Physical Anesthesia Plan  ASA: III  Anesthesia Plan: MAC   Post-op Pain Management:    Induction: Intravenous  PONV Risk Score and Plan:   Airway Management Planned: Nasal Cannula  Additional Equipment:   Intra-op Plan:   Post-operative Plan:   Informed Consent: I have reviewed the patients History and Physical, chart, labs and discussed the procedure including the risks, benefits and alternatives for the proposed anesthesia with the patient or authorized representative who has indicated his/her understanding and acceptance.     Plan Discussed with:   Anesthesia Plan Comments:         Anesthesia Quick Evaluation  

## 2017-03-28 NOTE — Transfer of Care (Signed)
Immediate Anesthesia Transfer of Care Note  Patient: Alexander Frank.  Procedure(s) Performed: CATARACT EXTRACTION PHACO AND INTRAOCULAR LENS PLACEMENT (IOC) (Left Eye)  Patient Location: Short Stay  Anesthesia Type:MAC  Level of Consciousness: awake  Airway & Oxygen Therapy: Patient Spontanous Breathing  Post-op Assessment: Report given to RN and Post -op Vital signs reviewed and stable  Post vital signs: Reviewed and stable  Last Vitals:  Vitals:   03/28/17 0755 03/28/17 0800  BP: 119/63   Pulse:    Resp: (!) 21 18  Temp:    SpO2: 97% 97%    Last Pain: There were no vitals filed for this visit.       Complications: No apparent anesthesia complications

## 2017-03-28 NOTE — H&P (Signed)
I have reviewed the H&P, the patient was re-examined, and I have identified no interval changes in medical condition and plan of care since the history and physical of record  

## 2017-03-28 NOTE — Discharge Instructions (Signed)

## 2017-03-28 NOTE — Anesthesia Postprocedure Evaluation (Signed)
Anesthesia Post Note  Patient: Alexander Frank.  Procedure(s) Performed: CATARACT EXTRACTION PHACO AND INTRAOCULAR LENS PLACEMENT (IOC) (Left Eye)  Patient location during evaluation: PACU Anesthesia Type: MAC Level of consciousness: awake Pain management: pain level controlled Vital Signs Assessment: post-procedure vital signs reviewed and stable Respiratory status: spontaneous breathing, nonlabored ventilation and respiratory function stable Cardiovascular status: blood pressure returned to baseline Postop Assessment: no apparent nausea or vomiting Anesthetic complications: no     Last Vitals:  Vitals:   03/28/17 0755 03/28/17 0800  BP: 119/63   Pulse:    Resp: (!) 21 18  Temp:    SpO2: 97% 97%    Last Pain: There were no vitals filed for this visit.               Zacherie Honeyman,Yousef J

## 2017-03-29 ENCOUNTER — Encounter (HOSPITAL_COMMUNITY): Payer: Self-pay | Admitting: Ophthalmology

## 2017-06-21 ENCOUNTER — Ambulatory Visit: Payer: Medicare Other | Admitting: Urology

## 2017-06-21 ENCOUNTER — Other Ambulatory Visit (HOSPITAL_COMMUNITY)
Admission: AD | Admit: 2017-06-21 | Discharge: 2017-06-21 | Disposition: A | Discharge status: Home / Self Care | Payer: Medicare Other | Source: Skilled Nursing Facility | Attending: Urology | Admitting: Urology

## 2017-06-21 DIAGNOSIS — C61 Malignant neoplasm of prostate: Secondary | ICD-10-CM

## 2017-06-21 DIAGNOSIS — N200 Calculus of kidney: Secondary | ICD-10-CM | POA: Insufficient documentation

## 2017-06-23 LAB — URINE CULTURE: CULTURE: NO GROWTH

## 2017-09-07 ENCOUNTER — Emergency Department (HOSPITAL_COMMUNITY)
Admission: EM | Admit: 2017-09-07 | Discharge: 2017-09-07 | Disposition: A | Discharge status: Home / Self Care | Payer: Medicare Other | Source: Home / Self Care | Attending: Emergency Medicine | Admitting: Emergency Medicine

## 2017-09-07 ENCOUNTER — Emergency Department (HOSPITAL_COMMUNITY)
Admission: EM | Admit: 2017-09-07 | Discharge: 2017-09-07 | Disposition: A | Discharge status: Home / Self Care | Payer: Medicare Other | Attending: Emergency Medicine | Admitting: Emergency Medicine

## 2017-09-07 ENCOUNTER — Encounter (HOSPITAL_COMMUNITY): Payer: Self-pay | Admitting: Emergency Medicine

## 2017-09-07 ENCOUNTER — Other Ambulatory Visit: Payer: Self-pay

## 2017-09-07 DIAGNOSIS — Z7984 Long term (current) use of oral hypoglycemic drugs: Secondary | ICD-10-CM | POA: Diagnosis not present

## 2017-09-07 DIAGNOSIS — Z87891 Personal history of nicotine dependence: Secondary | ICD-10-CM

## 2017-09-07 DIAGNOSIS — R1033 Periumbilical pain: Secondary | ICD-10-CM | POA: Diagnosis present

## 2017-09-07 DIAGNOSIS — Z8546 Personal history of malignant neoplasm of prostate: Secondary | ICD-10-CM | POA: Insufficient documentation

## 2017-09-07 DIAGNOSIS — R310 Gross hematuria: Secondary | ICD-10-CM | POA: Insufficient documentation

## 2017-09-07 DIAGNOSIS — R338 Other retention of urine: Secondary | ICD-10-CM

## 2017-09-07 DIAGNOSIS — Z79899 Other long term (current) drug therapy: Secondary | ICD-10-CM | POA: Insufficient documentation

## 2017-09-07 DIAGNOSIS — Z466 Encounter for fitting and adjustment of urinary device: Secondary | ICD-10-CM | POA: Diagnosis not present

## 2017-09-07 DIAGNOSIS — I10 Essential (primary) hypertension: Secondary | ICD-10-CM | POA: Insufficient documentation

## 2017-09-07 DIAGNOSIS — T839XXA Unspecified complication of genitourinary prosthetic device, implant and graft, initial encounter: Secondary | ICD-10-CM

## 2017-09-07 DIAGNOSIS — E119 Type 2 diabetes mellitus without complications: Secondary | ICD-10-CM | POA: Insufficient documentation

## 2017-09-07 LAB — CBC WITH DIFFERENTIAL/PLATELET
Basophils Absolute: 0 10*3/uL (ref 0.0–0.1)
Basophils Relative: 0 %
EOS ABS: 0 10*3/uL (ref 0.0–0.7)
Eosinophils Relative: 0 %
HCT: 36 % — ABNORMAL LOW (ref 39.0–52.0)
HEMOGLOBIN: 12.7 g/dL — AB (ref 13.0–17.0)
LYMPHS ABS: 1.1 10*3/uL (ref 0.7–4.0)
Lymphocytes Relative: 5 %
MCH: 25.9 pg — AB (ref 26.0–34.0)
MCHC: 35.3 g/dL (ref 30.0–36.0)
MCV: 73.3 fL — ABNORMAL LOW (ref 78.0–100.0)
MONO ABS: 1 10*3/uL (ref 0.1–1.0)
MONOS PCT: 5 %
NEUTROS PCT: 90 %
Neutro Abs: 17.6 10*3/uL — ABNORMAL HIGH (ref 1.7–7.7)
Platelets: 252 10*3/uL (ref 150–400)
RBC: 4.91 MIL/uL (ref 4.22–5.81)
RDW: 15.4 % (ref 11.5–15.5)
WBC: 19.6 10*3/uL — ABNORMAL HIGH (ref 4.0–10.5)

## 2017-09-07 LAB — URINALYSIS, ROUTINE W REFLEX MICROSCOPIC
Bacteria, UA: NONE SEEN
RBC / HPF: >50 RBC/hpf — ABNORMAL HIGH (ref 0–5)
WBC, UA: >50 WBC/hpf — ABNORMAL HIGH (ref 0–5)

## 2017-09-07 LAB — BASIC METABOLIC PANEL
Anion gap: 15 (ref 5–15)
BUN: 25 mg/dL — AB (ref 6–20)
CHLORIDE: 99 mmol/L — AB (ref 101–111)
CO2: 15 mmol/L — ABNORMAL LOW (ref 22–32)
CREATININE: 1.53 mg/dL — AB (ref 0.61–1.24)
Calcium: 8.7 mg/dL — ABNORMAL LOW (ref 8.9–10.3)
GFR calc Af Amer: 47 mL/min — ABNORMAL LOW (ref >60)
GFR calc non Af Amer: 41 mL/min — ABNORMAL LOW (ref >60)
Glucose, Bld: 295 mg/dL — ABNORMAL HIGH (ref 65–99)
Potassium: 4.1 mmol/L (ref 3.5–5.1)
SODIUM: 129 mmol/L — AB (ref 135–145)

## 2017-09-07 LAB — URINALYSIS, MICROSCOPIC (REFLEX)

## 2017-09-07 MED ORDER — OXYCODONE HCL 5 MG PO TABS
5.0000 mg | ORAL_TABLET | Freq: Once | ORAL | Status: AC
  Administered 2017-09-07: 5 mg via ORAL
  Filled 2017-09-07: qty 1

## 2017-09-07 MED ORDER — ACETAMINOPHEN 500 MG PO TABS
1000.0000 mg | ORAL_TABLET | Freq: Once | ORAL | Status: AC
  Administered 2017-09-07: 1000 mg via ORAL
  Filled 2017-09-07: qty 2

## 2017-09-07 MED ORDER — PHENAZOPYRIDINE HCL 200 MG PO TABS: 200.0000 mg | ORAL_TABLET | Freq: Three times a day (TID) | ORAL | 0 refills | Status: DC

## 2017-09-07 MED ORDER — CEFPODOXIME PROXETIL 100 MG PO TABS: 100.0000 mg | ORAL_TABLET | Freq: Two times a day (BID) | ORAL | 0 refills | Status: DC

## 2017-09-07 MED ORDER — PHENAZOPYRIDINE HCL 100 MG PO TABS
200.0000 mg | ORAL_TABLET | Freq: Once | ORAL | Status: AC
  Administered 2017-09-07: 200 mg via ORAL
  Filled 2017-09-07: qty 2

## 2017-09-07 NOTE — ED Notes (Signed)
Pt had lg clots in pants when undressed for triage

## 2017-09-07 NOTE — ED Provider Notes (Signed)
[facility] EMERGENCY DEPARTMENT Provider Note   CSN: [id number] Arrival date & time: 09/07/17  0153  Time seen 02:25 AM   History   Chief Complaint Chief Complaint  Patient presents with  . Urinary Retention    HPI Alexander Frank. is a 84 y.o. male.  HPI   patient states he started having gross hematuria about 10 PM tonight.  Shortly afterwards he felt like he was unable to urinate.  He complains of lower abdominal discomfort.  He has a history of prostate cancer and states [facility] is following him and states his PSAs have been good.  He states his last cystoscopy was about a year ago and as far as he knows it was normal.  He states he has had hematuria before but he relates it to a kidney stone.  Patient has not had any flank pain.  PCP Monico Blitz, MD [facility]  Past Medical History:  Diagnosis Date  . Cancer [facility])    prostate cancer  . Diabetes mellitus without complication ([place])   . History of kidney stones   . Hypertension     There are no active problems to display for this patient.   Past Surgical History:  Procedure Laterality Date  . CATARACT EXTRACTION W/PHACO Right 02/21/2017   Procedure: CATARACT EXTRACTION PHACO AND INTRAOCULAR LENS PLACEMENT (IOC);  Surgeon: Tonny Branch, MD;  Location: [facility];  Service: Ophthalmology;  Laterality: Right;  CDE: 10.14  . CATARACT EXTRACTION W/PHACO Left 03/28/2017   Procedure: CATARACT EXTRACTION PHACO AND INTRAOCULAR LENS PLACEMENT (IOC);  Surgeon: Tonny Branch, MD;  Location: [facility];  Service: Ophthalmology;  Laterality: Left;  CDE: 8.57  . LITHOTRIPSY     x3        Home Medications    Prior to Admission medications   Medication Sig Start Date End Date Taking? Authorizing Provider  glipiZIDE (GLUCOTROL) 10 MG tablet Take 10 mg by mouth daily. 01/06/17  Yes [provider]  losartan (COZAAR) 50 MG tablet Take 50 mg by mouth daily. 12/21/16  Yes [provider]  metFORMIN  (GLUCOPHAGE) 500 MG tablet Take 500 mg by mouth 4 (four) times daily. 02/10/17  Yes [provider]  NIFEdipine (PROCARDIA-XL/ADALAT-CC/NIFEDICAL-XL) 30 MG 24 hr tablet Take 30 mg by mouth daily. 02/09/17  Yes [provider]  phenazopyridine (PYRIDIUM) 200 MG tablet Take 1 tablet (200 mg total) by mouth 3 (three) times daily. 09/07/17   Rolland Porter, MD  traMADol (ULTRAM) 50 MG tablet Take 50 mg by mouth 2 (two) times daily as needed for moderate pain.    [provider]    Family History No family history on file.  Social History Social History   Tobacco Use  . Smoking status: Former Smoker    Packs/day: 0.50    Years: 12.00    Pack years: 6.00    Types: Cigarettes    Last attempt to quit: 02/17/1959    Years since quitting: 58.5  . Smokeless tobacco: Never Used  Substance Use Topics  . Alcohol use: No  . Drug use: No  lives at home Lives with spouse   Allergies   Patient has no known allergies.   Review of Systems Review of Systems  All other systems reviewed and are negative.    Physical Exam Updated Vital Signs BP (!) 156/92 (BP Location: Left Arm)   Pulse (!) 102   Temp 97.6 F (36.4 C) (Oral)   Resp 20   Ht 5\' 5"  (  1.651 m)   Wt 86.2 kg (190 lb)   SpO2 99%   BMI 31.62 kg/m   Vital signs normal except tachycardia, hypertension   Physical Exam  Constitutional: He is oriented to person, place, and time. He appears well-developed and well-nourished. He appears distressed.  HENT:  Head: Normocephalic and atraumatic.  Right Ear: External ear normal.  Left Ear: External ear normal.  Eyes: Conjunctivae and EOM are normal.  Cardiovascular: Normal rate.  Pulmonary/Chest: Effort normal. No respiratory distress.  Abdominal: Soft. There is tenderness in the suprapubic area.  Patient examined after Foley catheter was placed, he has gross hematuria.  Musculoskeletal: Normal range of motion.  Neurological: He is alert and oriented to  person, place, and time.  Skin: Skin is warm and dry.  Psychiatric: His mood appears anxious. His speech is rapid and/or pressured.  Nursing note and vitals reviewed.    ED Treatments / Results  Labs (all labs ordered are listed, but only abnormal results are displayed) Results for orders placed or performed during the hospital encounter of 09/07/17  Urinalysis, Routine w reflex microscopic  Result Value Ref Range   Color, Urine RED (A) YELLOW   APPearance TURBID (A) CLEAR   Specific Gravity, Urine  1.005 - 1.030    TEST NOT REPORTED DUE TO COLOR INTERFERENCE OF URINE PIGMENT   pH  5.0 - 8.0    TEST NOT REPORTED DUE TO COLOR INTERFERENCE OF URINE PIGMENT   Glucose, UA (A) NEGATIVE mg/dL    TEST NOT REPORTED DUE TO COLOR INTERFERENCE OF URINE PIGMENT   Hgb urine dipstick (A) NEGATIVE    TEST NOT REPORTED DUE TO COLOR INTERFERENCE OF URINE PIGMENT   Bilirubin Urine (A) NEGATIVE    TEST NOT REPORTED DUE TO COLOR INTERFERENCE OF URINE PIGMENT   Ketones, ur (A) NEGATIVE mg/dL    TEST NOT REPORTED DUE TO COLOR INTERFERENCE OF URINE PIGMENT   Protein, ur (A) NEGATIVE mg/dL    TEST NOT REPORTED DUE TO COLOR INTERFERENCE OF URINE PIGMENT   Nitrite (A) NEGATIVE    TEST NOT REPORTED DUE TO COLOR INTERFERENCE OF URINE PIGMENT   Leukocytes, UA (A) NEGATIVE    TEST NOT REPORTED DUE TO COLOR INTERFERENCE OF URINE PIGMENT  Urinalysis, Microscopic (reflex)  Result Value Ref Range   RBC / HPF >50 0 - 5 RBC/hpf   WBC, UA 0-5 0 - 5 WBC/hpf   Bacteria, UA MANY (A) NONE SEEN   Squamous Epithelial / LPF 0-5 0 - 5   Laboratory interpretation all normal except hematuria, bactiuria    EKG None  Radiology No results found.  Procedures Procedures (including critical care time)  Medications Ordered in ED Medications  phenazopyridine (PYRIDIUM) tablet 200 mg (has no administration in time range)     Initial Impression / Assessment and Plan / ED Course  I have reviewed the triage vital  signs and the nursing notes.  Pertinent labs & imaging results that were available during my care of the patient were reviewed by me and considered in my medical decision making (see chart for details).    Patient's bladder scan was 378 cc.  Foley catheter was placed with gross bloody urine, he has about 500 cc in the bag.  Patient complains of pain from the Foley catheter, his wife states he always complains of it being uncomfortable.  He states he feels like he needs to urinate.  There is no urine in the catheter tube at this time, go to  have nursing staff irrigated to make sure he does not have a clot that is blocking the tube.  He was given Pyridium for his complaints of discomfort.  Concern is that he has a bladder tumor or some other urological problem causing the bleeding, however that would need to be determined by his urologist.  Patient is not on any blood thinners.  Nurses report that catheter is not draining urine now and is draining around it.  They are going to replace it with a larger catheter.   Final Clinical Impressions(s) / ED Diagnoses   Final diagnoses:  Gross hematuria  Acute urinary retention    ED Discharge Orders        Ordered    phenazopyridine (PYRIDIUM) 200 MG tablet  3 times daily     09/07/17 0238     Plan discharge  Rolland Porter, MD, Barbette Or, MD 09/07/17 [id number]

## 2017-09-07 NOTE — ED Notes (Signed)
Irrigated pt's foley there was blood and blood clots returned. EDP notified.

## 2017-09-07 NOTE — ED Triage Notes (Signed)
Pt recently seen for urinary retention and had catheter placed. Positive for output but continued severe pain.

## 2017-09-07 NOTE — ED Provider Notes (Signed)
[name] DEPT Provider Note   CSN: [id number] Arrival date & time: 09/07/17  0841     History   Chief Complaint Chief Complaint  Patient presents with  . Bladder Pain    HPI Alexander Frank. is a 84 y.o. male.  84 yo M with a chief complaint of suprapubic abdominal pain.  This is been going on since yesterday.  The patient went to the emergency department at [facility], and was found to have hematuria and urinary retention.  A catheter was placed which became clotted and had a larger one was placed and he was discharged home.  Had some bladder irrigation prior to discharge.  He had passage of a couple large clots per the family and then stopped passing urine about 9 PM last night.  Having slowly worsening suprapubic abdominal pain.  Denies fevers or chills denies trauma.  The history is provided by the patient.  Abdominal Pain   This is a recurrent problem. The current episode started 6 to 12 hours ago. The problem occurs constantly. The problem has not changed since onset.The pain is associated with an unknown factor. The pain is located in the suprapubic region. The quality of the pain is sharp and shooting. The pain is at a severity of 10/10. The pain is severe. Pertinent negatives include fever, diarrhea, vomiting, headaches, arthralgias and myalgias. Nothing aggravates the symptoms. Nothing relieves the symptoms.    Past Medical History:  Diagnosis Date  . Cancer [facility])    prostate cancer  . Diabetes mellitus without complication ([place])   . History of kidney stones   . Hypertension     There are no active problems to display for this patient.   Past Surgical History:  Procedure Laterality Date  . CATARACT EXTRACTION W/PHACO Right 02/21/2017   Procedure: CATARACT EXTRACTION PHACO AND INTRAOCULAR LENS PLACEMENT (IOC);  Surgeon: Tonny Branch, MD;  Location: [facility];  Service: Ophthalmology;  Laterality: Right;  CDE: 10.14  . CATARACT  EXTRACTION W/PHACO Left 03/28/2017   Procedure: CATARACT EXTRACTION PHACO AND INTRAOCULAR LENS PLACEMENT (IOC);  Surgeon: Tonny Branch, MD;  Location: [facility];  Service: Ophthalmology;  Laterality: Left;  CDE: 8.57  . LITHOTRIPSY     x3        Home Medications    Prior to Admission medications   Medication Sig Start Date End Date Taking? Authorizing Provider  glipiZIDE (GLUCOTROL) 10 MG tablet Take 10 mg by mouth daily. 01/06/17   [provider]  losartan (COZAAR) 50 MG tablet Take 50 mg by mouth daily. 12/21/16   [provider]  metFORMIN (GLUCOPHAGE) 500 MG tablet Take 500 mg by mouth 4 (four) times daily. 02/10/17   [provider]  NIFEdipine (PROCARDIA-XL/ADALAT-CC/NIFEDICAL-XL) 30 MG 24 hr tablet Take 30 mg by mouth daily. 02/09/17   [provider]  phenazopyridine (PYRIDIUM) 200 MG tablet Take 1 tablet (200 mg total) by mouth 3 (three) times daily. 09/07/17   Rolland Porter, MD  traMADol (ULTRAM) 50 MG tablet Take 50 mg by mouth 2 (two) times daily as needed for moderate pain.    [provider]    Family History No family history on file.  Social History Social History   Tobacco Use  . Smoking status: Former Smoker    Packs/day: 0.50    Years: 12.00    Pack years: 6.00    Types: Cigarettes    Last attempt to quit: 02/17/1959    Years since quitting: 58.5  .  Smokeless tobacco: Never Used  Substance Use Topics  . Alcohol use: No  . Drug use: No     Allergies   Patient has no known allergies.   Review of Systems Review of Systems  Constitutional: Negative for chills and fever.  HENT: Negative for congestion and facial swelling.   Eyes: Negative for discharge and visual disturbance.  Respiratory: Negative for shortness of breath.   Cardiovascular: Negative for chest pain and palpitations.  Gastrointestinal: Positive for abdominal pain. Negative for diarrhea and vomiting.  Musculoskeletal: Negative for arthralgias and  myalgias.  Skin: Negative for color change and rash.  Neurological: Negative for tremors, syncope and headaches.  Psychiatric/Behavioral: Negative for confusion and dysphoric mood.     Physical Exam Updated Vital Signs BP 125/77 (BP Location: Left Arm)   Pulse (!) 114   Temp 97.8 F (36.6 C) (Oral)   Resp 18   SpO2 96%   Physical Exam  Constitutional: He is oriented to person, place, and time. He appears well-developed and well-nourished.  HENT:  Head: Normocephalic and atraumatic.  Eyes: Pupils are equal, round, and reactive to light. EOM are normal.  Neck: Normal range of motion. Neck supple. No JVD present.  Cardiovascular: Normal rate and regular rhythm. Exam reveals no gallop and no friction rub.  No murmur heard. Pulmonary/Chest: No respiratory distress. He has no wheezes.  Abdominal: He exhibits mass (palpable bladder). He exhibits no distension. There is tenderness (suprapubic). There is no rebound and no guarding.  Musculoskeletal: Normal range of motion.  Neurological: He is alert and oriented to person, place, and time.  Skin: No rash noted. No pallor.  Psychiatric: He has a normal mood and affect. His behavior is normal.  Nursing note and vitals reviewed.    ED Treatments / Results  Labs (all labs ordered are listed, but only abnormal results are displayed) Labs Reviewed  URINALYSIS, ROUTINE W REFLEX MICROSCOPIC  CBC WITH DIFFERENTIAL/PLATELET  BASIC METABOLIC PANEL    EKG None  Radiology No results found.  Procedures Procedures (including critical care time) Emergency Focused Ultrasound Exam Limited ultrasound of the Bladder.   Performed and interpreted by Dr. Tyrone Nine Indication: evaluation for urinary retention Transverse and Sagittal views of the bladder are obtained and calculations are performed to determine an estimated bladder volume for signs of post-renal obstruction.  Findings: Bladder is 680cc full Interpretation:  evidence of outlet  obstruction Images archived electronically.  CPT Codes: [phone] and [phone]  Medications Ordered in ED Medications  acetaminophen (TYLENOL) tablet 1,000 mg (has no administration in time range)  oxyCODONE (Oxy IR/ROXICODONE) immediate release tablet 5 mg (has no administration in time range)     Initial Impression / Assessment and Plan / ED Course  I have reviewed the triage vital signs and the nursing notes.  Pertinent labs & imaging results that were available during my care of the patient were reviewed by me and considered in my medical decision making (see chart for details).     84 yo M with an issue with his Foley catheter.  The patient is retaining urine.  Suspect this is due to clots.  We will have the nursing staff irrigate it and reassess.  Patient feels completely better on reassessment doubt intraabdominal process.  Requesting d/c home.  Patient has a mild AKI with acidosis.  Will have this rechecked in one week.  Will call the urologist today.   1:11 PM:  I have discussed the diagnosis/risks/treatment options with the patient and family and  believe the pt to be eligible for discharge home to follow-up with PCP, urology. We also discussed returning to the ED immediately if new or worsening sx occur. We discussed the sx which are most concerning (e.g., sudden worsening pain, fever, inability to tolerate by mouth) that necessitate immediate return. Medications administered to the patient during their visit and any new prescriptions provided to the patient are listed below.  Medications given during this visit Medications  acetaminophen (TYLENOL) tablet 1,000 mg (1,000 mg Oral Given 09/07/17 1039)  oxyCODONE (Oxy IR/ROXICODONE) immediate release tablet 5 mg (5 mg Oral Given 09/07/17 1039)     The patient appears reasonably screen and/or stabilized for discharge and I doubt any other medical condition or other [facility] requiring further screening, evaluation, or treatment in the ED at  this time prior to discharge.    Final Clinical Impressions(s) / ED Diagnoses   Final diagnoses:  None    ED Discharge Orders    None       Deno Etienne, DO 09/07/17 1313

## 2017-09-07 NOTE — ED Notes (Signed)
Lab called requesting information concerning pt's urine.  Info given.

## 2017-09-07 NOTE — ED Triage Notes (Signed)
Pt states he has been unable to urinate since 2200 09/06/17 and when he has tried to urinated since then he has only been able to pass clots and blood

## 2017-09-07 NOTE — ED Notes (Signed)
Leg bag placed on pt

## 2017-09-07 NOTE — Discharge Instructions (Signed)
Return for worsening symptoms.    

## 2017-09-07 NOTE — ED Notes (Signed)
Bladder scan 378 ml 

## 2017-09-07 NOTE — Discharge Instructions (Signed)
Please call [facility] to let them know about your ED visit tonight. They will want to evaluate you for the source of the bleeding. Return to the ED if your catheter stops draining, you get abdominal pain, vomiting or pain in your flank.

## 2017-09-08 ENCOUNTER — Observation Stay (HOSPITAL_COMMUNITY)
Admission: AD | Admit: 2017-09-08 | Discharge: 2017-09-09 | Disposition: A | Discharge status: Home / Self Care | Payer: Medicare Other | Source: Other Acute Inpatient Hospital | Attending: Urology | Admitting: Urology

## 2017-09-08 ENCOUNTER — Encounter (HOSPITAL_COMMUNITY): Payer: Self-pay | Admitting: *Deleted

## 2017-09-08 ENCOUNTER — Other Ambulatory Visit: Payer: Self-pay

## 2017-09-08 DIAGNOSIS — R310 Gross hematuria: Secondary | ICD-10-CM | POA: Diagnosis present

## 2017-09-08 DIAGNOSIS — E119 Type 2 diabetes mellitus without complications: Secondary | ICD-10-CM | POA: Insufficient documentation

## 2017-09-08 DIAGNOSIS — I10 Essential (primary) hypertension: Secondary | ICD-10-CM | POA: Insufficient documentation

## 2017-09-08 DIAGNOSIS — Z87442 Personal history of urinary calculi: Secondary | ICD-10-CM | POA: Insufficient documentation

## 2017-09-08 DIAGNOSIS — Z7984 Long term (current) use of oral hypoglycemic drugs: Secondary | ICD-10-CM | POA: Insufficient documentation

## 2017-09-08 DIAGNOSIS — Z8546 Personal history of malignant neoplasm of prostate: Secondary | ICD-10-CM | POA: Insufficient documentation

## 2017-09-08 DIAGNOSIS — Z87440 Personal history of urinary (tract) infections: Secondary | ICD-10-CM | POA: Insufficient documentation

## 2017-09-08 DIAGNOSIS — Z79899 Other long term (current) drug therapy: Secondary | ICD-10-CM | POA: Insufficient documentation

## 2017-09-08 DIAGNOSIS — Z87891 Personal history of nicotine dependence: Secondary | ICD-10-CM | POA: Diagnosis not present

## 2017-09-08 DIAGNOSIS — Z7982 Long term (current) use of aspirin: Secondary | ICD-10-CM | POA: Insufficient documentation

## 2017-09-08 LAB — GLUCOSE, CAPILLARY
Glucose-Capillary: 94 mg/dL (ref 65–99)
Glucose-Capillary: 114 mg/dL — ABNORMAL HIGH (ref 65–99)

## 2017-09-08 MED ORDER — ACETAMINOPHEN 325 MG PO TABS: 650.0000 mg | ORAL_TABLET | ORAL | Status: DC | PRN

## 2017-09-08 MED ORDER — ZOLPIDEM TARTRATE 5 MG PO TABS: 5.0000 mg | ORAL_TABLET | Freq: Every evening | ORAL | Status: DC | PRN

## 2017-09-08 MED ORDER — ONDANSETRON HCL 4 MG/2ML IJ SOLN: 4.0000 mg | INTRAMUSCULAR | Status: DC | PRN

## 2017-09-08 MED ORDER — HYDROCODONE-ACETAMINOPHEN 5-325 MG PO TABS
1.0000 | ORAL_TABLET | ORAL | Status: DC | PRN
  Administered 2017-09-08: 1 via ORAL
  Filled 2017-09-08: qty 1

## 2017-09-08 MED ORDER — SODIUM CHLORIDE 0.9 % IV SOLN
INTRAVENOUS | Status: DC
  Administered 2017-09-08: 20:00:00 via INTRAVENOUS

## 2017-09-08 MED ORDER — MORPHINE SULFATE (PF) 4 MG/ML IV SOLN: 2.0000 mg | INTRAVENOUS | Status: DC | PRN

## 2017-09-08 MED ORDER — SODIUM CHLORIDE 0.9 % IV SOLN
2.0000 g | INTRAVENOUS | Status: DC
  Administered 2017-09-08: 2 g via INTRAVENOUS
  Filled 2017-09-08: qty 2

## 2017-09-08 MED ORDER — DIPHENHYDRAMINE HCL 12.5 MG/5ML PO ELIX: 12.5000 mg | ORAL_SOLUTION | Freq: Four times a day (QID) | ORAL | Status: DC | PRN

## 2017-09-08 MED ORDER — DIPHENHYDRAMINE HCL 50 MG/ML IJ SOLN: 12.5000 mg | Freq: Four times a day (QID) | INTRAMUSCULAR | Status: DC | PRN

## 2017-09-08 NOTE — H&P (Signed)
Urology Admission H&P  Chief Complaint: Gross hematuria  History of Present Illness: Alexander Frank is a 84yo with a history of prostate cancer, DMII, and recurrent UTIs who presented to my office today with gross hematuria and a clotted foley catheter. He had presented to [facility] ER with gross hematuria and an inability to urinate and a foley catheter was placed 2 days ago. The foley then stopped draining and he went to [place] where the foley was replaced. He then presented to my office with a nondraining foley and severe, constant, sharp, nonradiating suprapubic pain. The foley was replaced and 900cc of bloody urine drained. He was given IM rocephin. Currently his pain has resolved and his foley is draining well.   Past Medical History:  Diagnosis Date  . Cancer [facility])    prostate cancer  . Diabetes mellitus without complication ([place])   . History of kidney stones   . Hypertension    Past Surgical History:  Procedure Laterality Date  . CATARACT EXTRACTION W/PHACO Right 02/21/2017   Procedure: CATARACT EXTRACTION PHACO AND INTRAOCULAR LENS PLACEMENT (IOC);  Surgeon: Tonny Branch, MD;  Location: [facility];  Service: Ophthalmology;  Laterality: Right;  CDE: 10.14  . CATARACT EXTRACTION W/PHACO Left 03/28/2017   Procedure: CATARACT EXTRACTION PHACO AND INTRAOCULAR LENS PLACEMENT (IOC);  Surgeon: Tonny Branch, MD;  Location: [facility];  Service: Ophthalmology;  Laterality: Left;  CDE: 8.57  . LITHOTRIPSY     x3    Home Medications:  Current Facility-Administered Medications  Medication Dose Route Frequency Provider Last Rate Last Dose  . 0.9 %  sodium chloride infusion   Intravenous Continuous Kenzlie Disch, Candee Furbish, MD 50 mL/hr at 09/08/17 1931    . acetaminophen (TYLENOL) tablet 650 mg  650 mg Oral Q4H PRN Cleon Gustin, MD      . cefTRIAXone (ROCEPHIN) 2 g in sodium chloride 0.9 % 100 mL IVPB  2 g Intravenous Q24H Shamina Etheridge, Candee Furbish, MD      . diphenhydrAMINE (BENADRYL) injection 12.5  mg  12.5 mg Intravenous Q6H PRN Abram Sax, Candee Furbish, MD       Or  . diphenhydrAMINE (BENADRYL) 12.5 MG/5ML elixir 12.5 mg  12.5 mg Oral Q6H PRN Nanetta Wiegman, Candee Furbish, MD      . HYDROcodone-acetaminophen (NORCO/VICODIN) 5-325 MG per tablet 1-2 tablet  1-2 tablet Oral Q4H PRN Cleon Gustin, MD   1 tablet at 09/08/17 1926  . morphine 4 MG/ML injection 2-4 mg  2-4 mg Intravenous Q2H PRN Cleon Gustin, MD      . ondansetron [facility]) injection 4 mg  4 mg Intravenous Q4H PRN Cleon Gustin, MD      . zolpidem (AMBIEN) tablet 5 mg  5 mg Oral QHS PRN Alyson Ingles Candee Furbish, MD       Allergies: No Known Allergies  History reviewed. No pertinent family history. Social History:  reports that he quit smoking about 58 years ago. His smoking use included cigarettes. He has a 6.00 pack-year smoking history. He has never used smokeless tobacco. He reports that he does not drink alcohol or use drugs.  Review of Systems  Gastrointestinal: Positive for abdominal pain.  Genitourinary: Positive for hematuria and urgency.  All other systems reviewed and are negative.   Physical Exam:  Vital signs in last 24 hours: Temp:  [97.5 F (36.4 C)] 97.5 F (36.4 C) (04/25 1709) Pulse Rate:  [114] 114 (04/25 1709) Resp:  [19] 19 (04/25 1709) BP: (123)/(69) 123/69 (04/25 1709)  SpO2:  [97 %] 97 % (04/25 1709) Weight:  [85.9 kg (189 lb 4.8 oz)] 85.9 kg (189 lb 4.8 oz) (04/25 1709) Physical Exam  Constitutional: He is oriented to person, place, and time. He appears well-developed and well-nourished.  HENT:  Head: Normocephalic and atraumatic.  Eyes: Pupils are equal, round, and reactive to light. EOM are normal.  Neck: Normal range of motion. No thyromegaly present.  Cardiovascular: Normal rate and regular rhythm.  Respiratory: Effort normal. No respiratory distress.  GI: Soft. He exhibits no distension. There is no tenderness. Hernia confirmed negative in the right inguinal area and confirmed negative  in the left inguinal area.  Genitourinary: Testes normal and penis normal. No penile tenderness.  Musculoskeletal: Normal range of motion. He exhibits no edema.  Lymphadenopathy:       Right: No inguinal adenopathy present.       Left: No inguinal adenopathy present.  Neurological: He is alert and oriented to person, place, and time.  Skin: Skin is warm and dry.  Psychiatric: He has a normal mood and affect. His behavior is normal. Judgment and thought content normal.    Laboratory Data:  Results for orders placed or performed during the hospital encounter of 09/08/17 (from the past 24 hour(s))  Glucose, capillary     Status: None   Collection Time: 09/08/17  6:08 PM  Result Value Ref Range   Glucose-Capillary 94 65 - 99 mg/dL   No results found for this or any previous visit (from the past 240 hour(s)). Creatinine: Recent Labs    09/07/17 1051  CREATININE 1.53*   Baseline Creatinine: 1.5  Impression/Assessment:  84yo with gross hematuria, clot retention, and possible UTI  Plan:  1. The patient will be admitted for IV hydration and broad spectrum antibiotics pending his urine culture. We will not start CBI unless his hematuria worsens.   Nicolette Bang 09/08/2017, 8:38 PM

## 2017-09-09 LAB — URINE CULTURE: SPECIAL REQUESTS: NORMAL

## 2017-09-09 MED ORDER — CEFPODOXIME PROXETIL 100 MG PO TABS: 200.0000 mg | ORAL_TABLET | Freq: Two times a day (BID) | ORAL | 0 refills | Status: DC

## 2017-09-09 MED ORDER — CEFPODOXIME PROXETIL 200 MG PO TABS: 200.0000 mg | ORAL_TABLET | Freq: Two times a day (BID) | ORAL | 0 refills | Status: DC

## 2017-09-19 NOTE — Discharge Summary (Signed)
Physician Discharge Summary  Patient ID: Alexander Frank. MRN: [id number] DOB/AGE: 82-May-1935 82 y.o.  Admit date: 09/08/2017 Discharge date: 09/09/2017  Admission Diagnoses: Gross  Hematuria  Discharge Diagnoses:  Active Problems:   Gross hematuria   Discharged Condition: good  Hospital Course: The patient was admitted with gross hematuria and concern for UTI. His urine cleared overnight after clot evacuation and rocephin Prior to discharge the pt was tolerating a regular diet, pain was controlled on PO pain meds, they were ambulating without difficulty, and they had normal bowel function.    Consults: None  Significant Diagnostic Studies: none  Treatments: antibiotics: ceftriaxone  Discharge Exam: Blood pressure 116/66, pulse (!) 105, temperature 99.1 F (37.3 C), temperature source Oral, resp. rate 18, height 5\' 5"  (1.651 m), weight 85.9 kg (189 lb 4.8 oz), SpO2 95 %. General appearance: alert, cooperative and appears stated age Head: Normocephalic, without obvious abnormality, atraumatic Nose: Nares normal. Septum midline. Mucosa normal. No drainage or sinus tenderness. Resp: clear to auscultation bilaterally Cardio: regular rate and rhythm, S1, S2 normal, no murmur, click, rub or gallop GI: soft, non-tender; bowel sounds normal; no masses,  no organomegaly Extremities: extremities normal, atraumatic, no cyanosis or edema Neurologic: Grossly normal  Disposition:    Allergies as of 09/09/2017   No Known Allergies     Medication List    TAKE these medications   aspirin EC 81 MG tablet Take 81 mg by mouth 2 (two) times a week.   cefpodoxime 200 MG tablet Commonly known as:  VANTIN Take 1 tablet (200 mg total) by mouth 2 (two) times daily. What changed:    medication strength  how much to take   glipiZIDE 10 MG tablet Commonly known as:  GLUCOTROL Take 10 mg by mouth daily.   losartan 50 MG tablet Commonly known as:  COZAAR Take 50 mg by mouth  daily.   metFORMIN 500 MG tablet Commonly known as:  GLUCOPHAGE Take 500 mg by mouth 4 (four) times daily.   NIFEdipine 30 MG 24 hr tablet Commonly known as:  PROCARDIA-XL/ADALAT-CC/NIFEDICAL-XL Take 30 mg by mouth daily.   phenazopyridine 200 MG tablet Commonly known as:  PYRIDIUM Take 1 tablet (200 mg total) by mouth 3 (three) times daily.      Follow-up Information    [place] Grosser, Candee Furbish, MD. Call in 1 week(s).   Specialty:  Urology Contact information: [address] [place] [address] [phone]           Signed: Nicolette Bang 09/19/2017, 7:55 AM

## 2017-09-20 ENCOUNTER — Ambulatory Visit: Payer: Medicare Other | Admitting: Urology

## 2017-09-20 DIAGNOSIS — R310 Gross hematuria: Secondary | ICD-10-CM

## 2017-09-20 DIAGNOSIS — N401 Enlarged prostate with lower urinary tract symptoms: Secondary | ICD-10-CM

## 2017-09-20 DIAGNOSIS — R3914 Feeling of incomplete bladder emptying: Secondary | ICD-10-CM | POA: Diagnosis not present

## 2017-09-20 DIAGNOSIS — N201 Calculus of ureter: Secondary | ICD-10-CM

## 2017-09-20 DIAGNOSIS — C61 Malignant neoplasm of prostate: Secondary | ICD-10-CM | POA: Diagnosis not present

## 2017-12-13 ENCOUNTER — Other Ambulatory Visit: Payer: Self-pay | Admitting: Urology

## 2017-12-13 ENCOUNTER — Ambulatory Visit (HOSPITAL_COMMUNITY)
Admission: RE | Admit: 2017-12-13 | Discharge: 2017-12-13 | Disposition: A | Discharge status: Home / Self Care | Payer: Medicare Other | Source: Ambulatory Visit | Attending: Urology | Admitting: Urology

## 2017-12-13 DIAGNOSIS — N200 Calculus of kidney: Secondary | ICD-10-CM

## 2017-12-20 ENCOUNTER — Ambulatory Visit: Payer: Medicare Other | Admitting: Urology

## 2017-12-20 DIAGNOSIS — C61 Malignant neoplasm of prostate: Secondary | ICD-10-CM | POA: Diagnosis not present

## 2017-12-20 DIAGNOSIS — N201 Calculus of ureter: Secondary | ICD-10-CM | POA: Diagnosis not present

## 2017-12-20 DIAGNOSIS — R310 Gross hematuria: Secondary | ICD-10-CM

## 2018-06-02 ENCOUNTER — Other Ambulatory Visit: Payer: Self-pay | Admitting: Urology

## 2018-06-02 DIAGNOSIS — C61 Malignant neoplasm of prostate: Secondary | ICD-10-CM

## 2018-06-22 ENCOUNTER — Other Ambulatory Visit: Payer: Medicare Other

## 2018-06-22 ENCOUNTER — Ambulatory Visit
Admission: RE | Admit: 2018-06-22 | Discharge: 2018-06-22 | Disposition: A | Discharge status: Home / Self Care | Payer: Medicare Other | Source: Ambulatory Visit | Attending: Urology | Admitting: Urology

## 2018-06-22 DIAGNOSIS — C61 Malignant neoplasm of prostate: Secondary | ICD-10-CM

## 2018-06-22 MED ORDER — GADOBENATE DIMEGLUMINE 529 MG/ML IV SOLN
17.0000 mL | Freq: Once | INTRAVENOUS | Status: AC | PRN
  Administered 2018-06-22: 17 mL via INTRAVENOUS

## 2019-01-23 ENCOUNTER — Ambulatory Visit (INDEPENDENT_AMBULATORY_CARE_PROVIDER_SITE_OTHER): Payer: Medicare Other | Admitting: Urology

## 2019-01-23 DIAGNOSIS — N200 Calculus of kidney: Secondary | ICD-10-CM

## 2019-01-23 DIAGNOSIS — N5201 Erectile dysfunction due to arterial insufficiency: Secondary | ICD-10-CM

## 2019-01-23 DIAGNOSIS — C61 Malignant neoplasm of prostate: Secondary | ICD-10-CM | POA: Diagnosis not present

## 2019-05-07 ENCOUNTER — Other Ambulatory Visit: Payer: Self-pay

## 2019-05-07 DIAGNOSIS — C61 Malignant neoplasm of prostate: Secondary | ICD-10-CM

## 2019-05-18 HISTORY — PX: COLON RESECTION: SHX5231

## 2019-05-23 ENCOUNTER — Other Ambulatory Visit: Payer: Self-pay

## 2019-05-23 DIAGNOSIS — N200 Calculus of kidney: Secondary | ICD-10-CM

## 2019-05-31 DIAGNOSIS — I10 Essential (primary) hypertension: Secondary | ICD-10-CM | POA: Diagnosis not present

## 2019-05-31 DIAGNOSIS — E119 Type 2 diabetes mellitus without complications: Secondary | ICD-10-CM | POA: Diagnosis not present

## 2019-06-22 DIAGNOSIS — E119 Type 2 diabetes mellitus without complications: Secondary | ICD-10-CM | POA: Diagnosis not present

## 2019-06-22 DIAGNOSIS — I10 Essential (primary) hypertension: Secondary | ICD-10-CM | POA: Diagnosis not present

## 2019-06-23 DIAGNOSIS — K921 Melena: Secondary | ICD-10-CM | POA: Diagnosis not present

## 2019-06-23 DIAGNOSIS — Z7984 Long term (current) use of oral hypoglycemic drugs: Secondary | ICD-10-CM | POA: Diagnosis not present

## 2019-06-23 DIAGNOSIS — R58 Hemorrhage, not elsewhere classified: Secondary | ICD-10-CM | POA: Diagnosis not present

## 2019-06-23 DIAGNOSIS — I10 Essential (primary) hypertension: Secondary | ICD-10-CM | POA: Diagnosis not present

## 2019-06-23 DIAGNOSIS — E1165 Type 2 diabetes mellitus with hyperglycemia: Secondary | ICD-10-CM | POA: Diagnosis not present

## 2019-06-23 DIAGNOSIS — I959 Hypotension, unspecified: Secondary | ICD-10-CM | POA: Diagnosis not present

## 2019-06-23 DIAGNOSIS — D649 Anemia, unspecified: Secondary | ICD-10-CM | POA: Diagnosis not present

## 2019-06-23 DIAGNOSIS — Z87891 Personal history of nicotine dependence: Secondary | ICD-10-CM | POA: Diagnosis not present

## 2019-06-23 DIAGNOSIS — K922 Gastrointestinal hemorrhage, unspecified: Secondary | ICD-10-CM | POA: Diagnosis not present

## 2019-06-23 DIAGNOSIS — Z7982 Long term (current) use of aspirin: Secondary | ICD-10-CM | POA: Diagnosis not present

## 2019-06-24 DIAGNOSIS — Z0389 Encounter for observation for other suspected diseases and conditions ruled out: Secondary | ICD-10-CM | POA: Diagnosis not present

## 2019-06-24 DIAGNOSIS — I10 Essential (primary) hypertension: Secondary | ICD-10-CM | POA: Diagnosis not present

## 2019-06-24 DIAGNOSIS — D649 Anemia, unspecified: Secondary | ICD-10-CM | POA: Diagnosis not present

## 2019-06-24 DIAGNOSIS — K922 Gastrointestinal hemorrhage, unspecified: Secondary | ICD-10-CM | POA: Diagnosis not present

## 2019-06-25 DIAGNOSIS — I10 Essential (primary) hypertension: Secondary | ICD-10-CM | POA: Diagnosis not present

## 2019-06-25 DIAGNOSIS — D649 Anemia, unspecified: Secondary | ICD-10-CM | POA: Diagnosis not present

## 2019-06-25 DIAGNOSIS — K922 Gastrointestinal hemorrhage, unspecified: Secondary | ICD-10-CM | POA: Diagnosis not present

## 2019-06-25 DIAGNOSIS — Z0389 Encounter for observation for other suspected diseases and conditions ruled out: Secondary | ICD-10-CM | POA: Diagnosis not present

## 2019-07-02 DIAGNOSIS — K922 Gastrointestinal hemorrhage, unspecified: Secondary | ICD-10-CM | POA: Diagnosis not present

## 2019-07-02 DIAGNOSIS — I10 Essential (primary) hypertension: Secondary | ICD-10-CM | POA: Diagnosis not present

## 2019-07-02 DIAGNOSIS — K921 Melena: Secondary | ICD-10-CM | POA: Diagnosis not present

## 2019-07-02 DIAGNOSIS — E1165 Type 2 diabetes mellitus with hyperglycemia: Secondary | ICD-10-CM | POA: Diagnosis not present

## 2019-07-02 DIAGNOSIS — Z299 Encounter for prophylactic measures, unspecified: Secondary | ICD-10-CM | POA: Diagnosis not present

## 2019-07-02 DIAGNOSIS — Z79899 Other long term (current) drug therapy: Secondary | ICD-10-CM | POA: Diagnosis not present

## 2019-07-04 DIAGNOSIS — K921 Melena: Secondary | ICD-10-CM | POA: Insufficient documentation

## 2019-07-18 DIAGNOSIS — Z01818 Encounter for other preprocedural examination: Secondary | ICD-10-CM | POA: Diagnosis not present

## 2019-07-20 DIAGNOSIS — D126 Benign neoplasm of colon, unspecified: Secondary | ICD-10-CM | POA: Diagnosis not present

## 2019-07-20 DIAGNOSIS — Z79899 Other long term (current) drug therapy: Secondary | ICD-10-CM | POA: Diagnosis not present

## 2019-07-20 DIAGNOSIS — D123 Benign neoplasm of transverse colon: Secondary | ICD-10-CM | POA: Diagnosis not present

## 2019-07-20 DIAGNOSIS — N200 Calculus of kidney: Secondary | ICD-10-CM | POA: Diagnosis not present

## 2019-07-20 DIAGNOSIS — D509 Iron deficiency anemia, unspecified: Secondary | ICD-10-CM | POA: Diagnosis not present

## 2019-07-20 DIAGNOSIS — K2980 Duodenitis without bleeding: Secondary | ICD-10-CM | POA: Diagnosis not present

## 2019-07-20 DIAGNOSIS — K2950 Unspecified chronic gastritis without bleeding: Secondary | ICD-10-CM | POA: Diagnosis not present

## 2019-07-20 DIAGNOSIS — Z7984 Long term (current) use of oral hypoglycemic drugs: Secondary | ICD-10-CM | POA: Diagnosis not present

## 2019-07-20 DIAGNOSIS — K921 Melena: Secondary | ICD-10-CM | POA: Diagnosis not present

## 2019-07-20 DIAGNOSIS — C180 Malignant neoplasm of cecum: Secondary | ICD-10-CM | POA: Diagnosis not present

## 2019-07-20 DIAGNOSIS — I10 Essential (primary) hypertension: Secondary | ICD-10-CM | POA: Diagnosis not present

## 2019-07-20 DIAGNOSIS — D127 Benign neoplasm of rectosigmoid junction: Secondary | ICD-10-CM | POA: Diagnosis not present

## 2019-07-20 DIAGNOSIS — K641 Second degree hemorrhoids: Secondary | ICD-10-CM | POA: Diagnosis not present

## 2019-07-20 DIAGNOSIS — K639 Disease of intestine, unspecified: Secondary | ICD-10-CM | POA: Diagnosis not present

## 2019-07-20 DIAGNOSIS — I700 Atherosclerosis of aorta: Secondary | ICD-10-CM | POA: Diagnosis not present

## 2019-07-20 DIAGNOSIS — K635 Polyp of colon: Secondary | ICD-10-CM | POA: Diagnosis not present

## 2019-07-20 DIAGNOSIS — Z7982 Long term (current) use of aspirin: Secondary | ICD-10-CM | POA: Diagnosis not present

## 2019-07-20 DIAGNOSIS — D122 Benign neoplasm of ascending colon: Secondary | ICD-10-CM | POA: Diagnosis not present

## 2019-07-20 DIAGNOSIS — E119 Type 2 diabetes mellitus without complications: Secondary | ICD-10-CM | POA: Diagnosis not present

## 2019-07-25 DIAGNOSIS — C182 Malignant neoplasm of ascending colon: Secondary | ICD-10-CM | POA: Diagnosis not present

## 2019-07-26 ENCOUNTER — Other Ambulatory Visit: Payer: Self-pay | Admitting: Urology

## 2019-07-26 DIAGNOSIS — E1165 Type 2 diabetes mellitus with hyperglycemia: Secondary | ICD-10-CM | POA: Diagnosis not present

## 2019-07-26 DIAGNOSIS — C189 Malignant neoplasm of colon, unspecified: Secondary | ICD-10-CM | POA: Diagnosis not present

## 2019-07-26 DIAGNOSIS — Z87891 Personal history of nicotine dependence: Secondary | ICD-10-CM | POA: Diagnosis not present

## 2019-07-26 DIAGNOSIS — Z299 Encounter for prophylactic measures, unspecified: Secondary | ICD-10-CM | POA: Diagnosis not present

## 2019-07-26 DIAGNOSIS — I10 Essential (primary) hypertension: Secondary | ICD-10-CM | POA: Diagnosis not present

## 2019-07-26 LAB — PSA: PSA: 7.7 ng/mL — ABNORMAL HIGH (ref <=4.0)

## 2019-07-31 ENCOUNTER — Other Ambulatory Visit: Payer: Self-pay

## 2019-07-31 ENCOUNTER — Encounter: Payer: Self-pay | Admitting: Urology

## 2019-07-31 ENCOUNTER — Ambulatory Visit: Payer: Medicare Other | Admitting: Urology

## 2019-07-31 ENCOUNTER — Encounter: Payer: Self-pay | Admitting: General Surgery

## 2019-07-31 ENCOUNTER — Ambulatory Visit: Payer: Medicare Other | Admitting: General Surgery

## 2019-07-31 VITALS — BP 127/69 | HR 87 | Temp 97.9°F | Resp 12 | Ht 65.0 in | Wt 181.0 lb

## 2019-07-31 VITALS — BP 108/63 | HR 76 | Temp 95.6°F | Ht 65.0 in | Wt 191.0 lb

## 2019-07-31 DIAGNOSIS — N200 Calculus of kidney: Secondary | ICD-10-CM | POA: Diagnosis not present

## 2019-07-31 DIAGNOSIS — C61 Malignant neoplasm of prostate: Secondary | ICD-10-CM | POA: Diagnosis not present

## 2019-07-31 DIAGNOSIS — C182 Malignant neoplasm of ascending colon: Secondary | ICD-10-CM

## 2019-07-31 LAB — POCT URINALYSIS DIPSTICK
Bilirubin, UA: NEGATIVE
Glucose, UA: NEGATIVE
Ketones, UA: NEGATIVE
Leukocytes, UA: NEGATIVE
Nitrite, UA: NEGATIVE
Protein, UA: POSITIVE — AB
Spec Grav, UA: 1.01 (ref 1.010–1.025)
Urobilinogen, UA: 0.2 E.U./dL
pH, UA: 7 (ref 5.0–8.0)

## 2019-07-31 NOTE — Progress Notes (Signed)
Urological Symptom Review  Patient is experiencing the following symptoms: Frequent urination Get up at night to urinate Erection problems (male only)  Kidney stones   Review of Systems  Gastrointestinal (upper)  : Negative for upper GI symptoms  Gastrointestinal (lower) : Negative for lower GI symptoms  Constitutional : Negative for symptoms  Skin: Negative for skin symptoms  Eyes: Negative for eye symptoms  Ear/Nose/Throat : Negative for Ear/Nose/Throat symptoms  Hematologic/Lymphatic: Negative for Hematologic/Lymphatic symptoms  Cardiovascular : Negative for cardiovascular symptoms  Respiratory : Negative for respiratory symptoms  Endocrine: Negative for endocrine symptoms  Musculoskeletal: Negative for musculoskeletal symptoms  Neurological: Negative for neurological symptoms  Psychologic: Negative for psychiatric symptoms

## 2019-07-31 NOTE — Progress Notes (Signed)
H&P  Chief Complaint: Prostate Cancer  History of Present Illness:   3.16.2021: Here today for follow-up. Most recent PSA 7.7 (this was on 3.11.2021). He denies any recent issues with kidney stones or stone sx's.   Of note, within the last few weeks he was diagnosed with colon cancer -- this is pending treatment. This was originally found after a single episode of blood per stool. He has been evaluated per CT scan (3.5.2021 -- records available).   (below copied from [facility] records):  Prostate Cancer:  Alexander Frank is a 86 year-old male established patient who is here evaluation for treatment of prostate cancer.  His prostate cancer was diagnosed 04/04/2011. His cancer was diagnosed by Dr Glendell Docker. His PSA at his time of diagnosis was 5.4. His most recent PSA is 6.7.   He underwent TRUS/Bx by Dr. Glendell Docker in [place], [place] in early November, 2012. At that time, he had a small left sided prostate nodule and a PSA of 5.4. 1/12 biopsies were positive for adenocarcinoma --the right apical lateral biopsy revealed GS 3+3 in a small focus. The other 11 biopsies were all negative. He decided on active surveillance, and had a repeat surveillance biopsy in May, 2014. This revealed one core, on the right mid lateral prostate, with a GS 3+3 in 5% of the core. All other biopsies were benign. Prostatic volume was 65 cc.   8.6.2019: He denies any recent bony pain. He has had no gross hematuria since his hospitalization in April of this year. He is on finasteride. Last PSA 6.8 in February 2019.   2.6.2020: MRI of prostate. Volume was 66 mL. TUR defect was present. PI-RADS category 3 lesion in right lateral mid/apical prostate.   3.23.2020: Underwent fusion biopsy.All 3 cored from ROI were benign. 2/12 routine cores revealed GS 3+3 pattern--both with 5% of cores involved.   9.8.2020: Most recent PSA was 6.7 (on 8.31.2020). He is reporting improved urinary sx's. He is being treated for diabetes and notes that  his urination is much better when he is controlling his sugar intake.    01/15/19 08/07/18 12/20/17 06/21/17 11/30/16 06/01/16 02/03/16 04/09/15  PSA  Total PSA 6.7 ng/dl 6.68 ng/mL 2.3 ng/dl 6.8 ng/dl 8.0 ng/dl 7.4 ng/dl 5.7  6.90    Kidney Stones:  The problem is on both sides. This is not his first kidney stone.   He has had eswl and ureteroscopy for treatment of his stones in the past.   He has a left-sided renal stone, 21 mm in size. He has had no significant flank pain recently.   2.5.2019: KUB performed in July, 2018. Revealed a stable 13 x 25 mm cluster of stones in the lower pole of the left kidney. No recent gross hematuria, dysuria, flank pain.   9.8.2020: He denies any hematuria, pain, or any other associated urinary sx's. He does note some occasional discomfort in his back that he attributes to his stones, but he tolerates this well.   Hematuria:  He first noticed the symptoms 09/07/2017.   He has had kidney stones.   Evaluated by Dr. Alyson Ingles on 4.25.2019 where clot evacuation/bladder irrigation was performed. He was treated with broad spectrum Abx. Urine c/s showed mixed growth. Symptoms actually began a few days earlier and he was seen in [place]. That catheter placed then eventually clotted off resulting in urology evaluation with Dr. Alyson Ingles. He was d/c'd on the 26th with an RX for [employer].   Past Medical History:  Diagnosis Date  Cancer [facility])    prostate cancer   Diabetes mellitus without complication ([place])    History of kidney stones    Hypertension     Past Surgical History:  Procedure Laterality Date   CATARACT EXTRACTION W/PHACO Right 02/21/2017   Procedure: CATARACT EXTRACTION PHACO AND INTRAOCULAR LENS PLACEMENT ([place]);  Surgeon: Tonny Branch, MD;  Location: [facility];  Service: Ophthalmology;  Laterality: Right;  CDE: 10.14   CATARACT EXTRACTION W/PHACO Left 03/28/2017   Procedure: CATARACT EXTRACTION PHACO AND INTRAOCULAR LENS PLACEMENT ([facility]);   Surgeon: Tonny Branch, MD;  Location: [facility];  Service: Ophthalmology;  Laterality: Left;  CDE: 8.57   LITHOTRIPSY     x3    Home Medications:  Allergies as of 07/31/2019   No Known Allergies     Medication List       Accurate as of July 31, 2019  2:42 PM. If you have any questions, ask your nurse or doctor.        STOP taking these medications   aspirin EC 81 MG tablet Stopped by: Aviva Signs, MD     TAKE these medications   amoxicillin 500 MG capsule Commonly known as: AMOXIL Take 500 mg by mouth 3 (three) times daily.   cefpodoxime 200 MG tablet Commonly known as: VANTIN Take 200 mg by mouth 2 (two) times daily. What changed: Another medication with the same name was removed. Continue taking this medication, and follow the directions you see here. Changed by: Aviva Signs, MD   clarithromycin 500 MG tablet Commonly known as: BIAXIN Take 500 mg by mouth 2 (two) times daily.   glipiZIDE 10 MG tablet Commonly known as: GLUCOTROL Take 10 mg by mouth daily.   losartan 50 MG tablet Commonly known as: COZAAR Take 50 mg by mouth daily.   metFORMIN 500 MG tablet Commonly known as: GLUCOPHAGE Take 500 mg by mouth 4 (four) times daily.   NIFEdipine 30 MG 24 hr tablet Commonly known as: PROCARDIA-XL/NIFEDICAL-XL Take 30 mg by mouth daily.   omeprazole 20 MG capsule Commonly known as: PRILOSEC Take 20 mg by mouth daily.   pantoprazole 40 MG tablet Commonly known as: PROTONIX Take 40 mg by mouth daily.   phenazopyridine 200 MG tablet Commonly known as: PYRIDIUM Take 1 tablet (200 mg total) by mouth 3 (three) times daily.       Allergies: No Known Allergies  No family history on file.  Social History:  reports that he quit smoking about 60 years ago. His smoking use included cigarettes. He has a 6.00 pack-year smoking history. He has never used smokeless tobacco. He reports that he does not drink alcohol or use drugs.  ROS: A complete review of systems  was performed.  All systems are negative except for pertinent findings as noted.  Physical Exam:  Vital signs in last 24 hours: BP 108/63    Pulse 76    Temp (!) 95.6 F (35.3 C)    Ht 5\' 5"  (1.651 m)    Wt 191 lb (86.6 kg)    BMI 31.78 kg/m  Constitutional:  Alert and oriented, No acute distress Cardiovascular: Regular rate  Respiratory: Normal respiratory effort GI: Abdomen is soft, nontender, nondistended, no abdominal masses. No CVAT.  Genitourinary: Normal male phallus (uncircumcised), testes are descended bilaterally and non-tender and without masses, scrotum is without lesions or masses but there is a left side 3 cm hydrocele., perineum is normal on inspection. Prostate feels around 60 grams in size. Lymphatic: No lymphadenopathy Neurologic:  Grossly intact, no focal deficits Psychiatric: Normal mood and affect    Results for orders placed or performed in visit on 07/31/19 (from the past 24 hour(s))  POCT urinalysis dipstick     Status: Abnormal   Collection Time: 07/31/19  2:14 PM  Result Value Ref Range   Color, UA yellow    Clarity, UA     Glucose, UA Negative Negative   Bilirubin, UA neg    Ketones, UA neg    Spec Grav, UA 1.010 1.010 - 1.025   Blood, UA trace intact    pH, UA 7.0 5.0 - 8.0   Protein, UA Positive (A) Negative   Urobilinogen, UA 0.2 0.2 or 1.0 E.U./dL   Nitrite, UA neg    Leukocytes, UA Negative Negative   Appearance clear    Odor     No results found for this or any previous visit (from the past 240 hour(s)).  I have reviewed prior pt notes  I have reviewed notes from referring/previous physicians  I have reviewed urinalysis results  I have independently reviewed prior imaging  I have reviewed prior PSA results  Radiologic Imaging: CT images viewed  Impression/Assessment:  Recent PSA is slightly elevated but I am not too concerned with his current trend -- will continue on surveillance.   He has had a recent CT for evaluation of his  newly diagnosed colon cancer. This showed left lower pole stable 16 mm stone.   UA today clear.   Plan:  We will continue to see him q 6 mo with PSA and KUB

## 2019-08-01 NOTE — Progress Notes (Signed)
Alexander Frank.; [id number]; 04-12-1934   HPI Patient is an 86yo BM who was referred by Dr. Manuella Ghazi for second opinion concerning recently diagnosed cecal carcinoma.  Patient has been seen by Dr. Ladona Horns, general surgery at [facility], who diagnosed adenocarcinoma of the cecum and has recommended a right hemicolectomy.  The patient presents for second opinion as to his treatment options.  Patient was having blood per rectum and the cancer was found on work-up.  He is also had a CT scan of the abdomen and preoperative labs.  His CEA level is within normal limits.  He currently has 0 out of 10 abdominal pain.  He states that Dr. Ladona Horns intends to perform the right hemicolectomy with a laparoscopic approach. Past Medical History:  Diagnosis Date  . Cancer [facility])    prostate cancer  . Diabetes mellitus without complication ([place])   . History of kidney stones   . Hypertension     Past Surgical History:  Procedure Laterality Date  . CATARACT EXTRACTION W/PHACO Right 02/21/2017   Procedure: CATARACT EXTRACTION PHACO AND INTRAOCULAR LENS PLACEMENT ([facility]);  Surgeon: Tonny Branch, MD;  Location: [facility];  Service: Ophthalmology;  Laterality: Right;  CDE: 10.14  . CATARACT EXTRACTION W/PHACO Left 03/28/2017   Procedure: CATARACT EXTRACTION PHACO AND INTRAOCULAR LENS PLACEMENT ([facility]);  Surgeon: Tonny Branch, MD;  Location: [facility];  Service: Ophthalmology;  Laterality: Left;  CDE: 8.57  . LITHOTRIPSY     x3    History reviewed. No pertinent family history.  Current Outpatient Medications on File Prior to Visit  Medication Sig Dispense Refill  . amoxicillin (AMOXIL) 500 MG capsule Take 500 mg by mouth 3 (three) times daily.    . clarithromycin (BIAXIN) 500 MG tablet Take 500 mg by mouth 2 (two) times daily.    Marland Kitchen glipiZIDE (GLUCOTROL) 10 MG tablet Take 10 mg by mouth daily.  3  . losartan (COZAAR) 50 MG tablet Take 50 mg by mouth daily.  0  . metFORMIN (GLUCOPHAGE) 500 MG tablet Take 500 mg by mouth 4  (four) times daily.  5  . NIFEdipine (PROCARDIA-XL/ADALAT-CC/NIFEDICAL-XL) 30 MG 24 hr tablet Take 30 mg by mouth daily.  6  . omeprazole (PRILOSEC) 20 MG capsule Take 20 mg by mouth daily.    . pantoprazole (PROTONIX) 40 MG tablet Take 40 mg by mouth daily.    . phenazopyridine (PYRIDIUM) 200 MG tablet Take 1 tablet (200 mg total) by mouth 3 (three) times daily. 6 tablet 0  . cefpodoxime (VANTIN) 200 MG tablet Take 200 mg by mouth 2 (two) times daily.     No current facility-administered medications on file prior to visit.    No Known Allergies  Social History   Substance and Sexual Activity  Alcohol Use No    Social History   Tobacco Use  Smoking Status Former Smoker  . Packs/day: 0.50  . Years: 12.00  . Pack years: 6.00  . Types: Cigarettes  . Quit date: 02/17/1959  . Years since quitting: 60.4  Smokeless Tobacco Never Used    Review of Systems  Constitutional: Negative.   HENT: Negative.   Eyes: Negative.   Respiratory: Negative.   Cardiovascular: Negative.   Gastrointestinal: Negative.   Genitourinary: Negative.   Musculoskeletal: Negative.   Skin: Negative.   Neurological: Negative.   Endo/Heme/Allergies: Negative.   Psychiatric/Behavioral: Negative.     Objective   Vitals:   07/31/19 1251  BP: 127/69  Pulse: 87  Resp: 12  Temp: 97.9 F (  36.6 C)  SpO2: 98%    Physical Exam Vitals reviewed.  Constitutional:      Appearance: Normal appearance. He is not ill-appearing.  HENT:     Head: Normocephalic and atraumatic.  Cardiovascular:     Rate and Rhythm: Normal rate and regular rhythm.     Heart sounds: Normal heart sounds. No murmur. No friction rub. No gallop.   Pulmonary:     Effort: Pulmonary effort is normal. No respiratory distress.     Breath sounds: Normal breath sounds. No stridor. No wheezing, rhonchi or rales.  Abdominal:     General: Bowel sounds are normal. There is no distension.     Palpations: Abdomen is soft. There is no mass.      Tenderness: There is no abdominal tenderness. There is no guarding or rebound.     Hernia: No hernia is present.  Skin:    General: Skin is warm and dry.  Neurological:     Mental Status: He is alert.   Records from [facility] and Dr. Ladona Horns reviewed including the colonoscopy, pathology reports, and the labs.  Assessment  Cecal carcinoma Plan   I told the patient that I agree with the need for a right hemicolectomy for his colon cancer.  I did answer any questions he had about the intended procedure.  I did tell him that he should have the procedure done at [facility] as he is received care there in the past and his primary care doctor has privileges at that facility.  He understands and will contact Dr. Sheron Nightingale office to schedule surgery.

## 2019-08-07 DIAGNOSIS — I10 Essential (primary) hypertension: Secondary | ICD-10-CM | POA: Diagnosis not present

## 2019-08-07 DIAGNOSIS — E119 Type 2 diabetes mellitus without complications: Secondary | ICD-10-CM | POA: Diagnosis not present

## 2019-08-13 DIAGNOSIS — Z01818 Encounter for other preprocedural examination: Secondary | ICD-10-CM | POA: Diagnosis not present

## 2019-08-15 DIAGNOSIS — I10 Essential (primary) hypertension: Secondary | ICD-10-CM | POA: Diagnosis not present

## 2019-08-15 DIAGNOSIS — D649 Anemia, unspecified: Secondary | ICD-10-CM | POA: Diagnosis not present

## 2019-08-15 DIAGNOSIS — R140 Abdominal distension (gaseous): Secondary | ICD-10-CM | POA: Diagnosis not present

## 2019-08-15 DIAGNOSIS — D72829 Elevated white blood cell count, unspecified: Secondary | ICD-10-CM | POA: Diagnosis not present

## 2019-08-15 DIAGNOSIS — Z87891 Personal history of nicotine dependence: Secondary | ICD-10-CM | POA: Diagnosis not present

## 2019-08-15 DIAGNOSIS — Z7984 Long term (current) use of oral hypoglycemic drugs: Secondary | ICD-10-CM | POA: Diagnosis not present

## 2019-08-15 DIAGNOSIS — C180 Malignant neoplasm of cecum: Secondary | ICD-10-CM | POA: Diagnosis not present

## 2019-08-15 DIAGNOSIS — C189 Malignant neoplasm of colon, unspecified: Secondary | ICD-10-CM | POA: Diagnosis not present

## 2019-08-15 DIAGNOSIS — R918 Other nonspecific abnormal finding of lung field: Secondary | ICD-10-CM | POA: Diagnosis not present

## 2019-08-15 DIAGNOSIS — E119 Type 2 diabetes mellitus without complications: Secondary | ICD-10-CM | POA: Diagnosis not present

## 2019-08-15 DIAGNOSIS — C182 Malignant neoplasm of ascending colon: Secondary | ICD-10-CM | POA: Diagnosis not present

## 2019-08-15 DIAGNOSIS — Z7982 Long term (current) use of aspirin: Secondary | ICD-10-CM | POA: Diagnosis not present

## 2019-08-15 DIAGNOSIS — Z4682 Encounter for fitting and adjustment of non-vascular catheter: Secondary | ICD-10-CM | POA: Diagnosis not present

## 2019-08-15 DIAGNOSIS — K567 Ileus, unspecified: Secondary | ICD-10-CM | POA: Diagnosis not present

## 2019-08-15 DIAGNOSIS — K6389 Other specified diseases of intestine: Secondary | ICD-10-CM | POA: Diagnosis not present

## 2019-09-06 DIAGNOSIS — Z87891 Personal history of nicotine dependence: Secondary | ICD-10-CM | POA: Diagnosis not present

## 2019-09-06 DIAGNOSIS — Z85038 Personal history of other malignant neoplasm of large intestine: Secondary | ICD-10-CM | POA: Diagnosis not present

## 2019-09-06 DIAGNOSIS — E1165 Type 2 diabetes mellitus with hyperglycemia: Secondary | ICD-10-CM | POA: Diagnosis not present

## 2019-09-06 DIAGNOSIS — I10 Essential (primary) hypertension: Secondary | ICD-10-CM | POA: Diagnosis not present

## 2019-09-06 DIAGNOSIS — M542 Cervicalgia: Secondary | ICD-10-CM | POA: Diagnosis not present

## 2019-09-06 DIAGNOSIS — Z299 Encounter for prophylactic measures, unspecified: Secondary | ICD-10-CM | POA: Diagnosis not present

## 2019-10-10 DIAGNOSIS — Z0000 Encounter for general adult medical examination without abnormal findings: Secondary | ICD-10-CM | POA: Diagnosis not present

## 2019-10-10 DIAGNOSIS — Z87891 Personal history of nicotine dependence: Secondary | ICD-10-CM | POA: Diagnosis not present

## 2019-10-10 DIAGNOSIS — Z1211 Encounter for screening for malignant neoplasm of colon: Secondary | ICD-10-CM | POA: Diagnosis not present

## 2019-10-10 DIAGNOSIS — Z299 Encounter for prophylactic measures, unspecified: Secondary | ICD-10-CM | POA: Diagnosis not present

## 2019-10-10 DIAGNOSIS — Z7189 Other specified counseling: Secondary | ICD-10-CM | POA: Diagnosis not present

## 2019-10-10 DIAGNOSIS — E1165 Type 2 diabetes mellitus with hyperglycemia: Secondary | ICD-10-CM | POA: Diagnosis not present

## 2019-10-14 DIAGNOSIS — E119 Type 2 diabetes mellitus without complications: Secondary | ICD-10-CM | POA: Diagnosis not present

## 2019-10-14 DIAGNOSIS — I10 Essential (primary) hypertension: Secondary | ICD-10-CM | POA: Diagnosis not present

## 2019-11-14 DIAGNOSIS — I10 Essential (primary) hypertension: Secondary | ICD-10-CM | POA: Diagnosis not present

## 2019-11-14 DIAGNOSIS — E119 Type 2 diabetes mellitus without complications: Secondary | ICD-10-CM | POA: Diagnosis not present

## 2019-12-14 DIAGNOSIS — I10 Essential (primary) hypertension: Secondary | ICD-10-CM | POA: Diagnosis not present

## 2019-12-14 DIAGNOSIS — E119 Type 2 diabetes mellitus without complications: Secondary | ICD-10-CM | POA: Diagnosis not present

## 2019-12-25 DIAGNOSIS — E119 Type 2 diabetes mellitus without complications: Secondary | ICD-10-CM | POA: Diagnosis not present

## 2019-12-25 DIAGNOSIS — I10 Essential (primary) hypertension: Secondary | ICD-10-CM | POA: Diagnosis not present

## 2020-01-03 ENCOUNTER — Other Ambulatory Visit: Payer: Self-pay

## 2020-01-03 DIAGNOSIS — C61 Malignant neoplasm of prostate: Secondary | ICD-10-CM

## 2020-01-17 DIAGNOSIS — E1165 Type 2 diabetes mellitus with hyperglycemia: Secondary | ICD-10-CM | POA: Diagnosis not present

## 2020-01-17 DIAGNOSIS — Z299 Encounter for prophylactic measures, unspecified: Secondary | ICD-10-CM | POA: Diagnosis not present

## 2020-01-17 DIAGNOSIS — Z713 Dietary counseling and surveillance: Secondary | ICD-10-CM | POA: Diagnosis not present

## 2020-01-17 DIAGNOSIS — I10 Essential (primary) hypertension: Secondary | ICD-10-CM | POA: Diagnosis not present

## 2020-01-22 DIAGNOSIS — H16223 Keratoconjunctivitis sicca, not specified as Sjogren's, bilateral: Secondary | ICD-10-CM | POA: Diagnosis not present

## 2020-01-22 DIAGNOSIS — E119 Type 2 diabetes mellitus without complications: Secondary | ICD-10-CM | POA: Diagnosis not present

## 2020-01-22 DIAGNOSIS — H524 Presbyopia: Secondary | ICD-10-CM | POA: Diagnosis not present

## 2020-01-28 DIAGNOSIS — R011 Cardiac murmur, unspecified: Secondary | ICD-10-CM | POA: Diagnosis not present

## 2020-01-28 DIAGNOSIS — C182 Malignant neoplasm of ascending colon: Secondary | ICD-10-CM | POA: Diagnosis not present

## 2020-01-30 DIAGNOSIS — C182 Malignant neoplasm of ascending colon: Secondary | ICD-10-CM | POA: Diagnosis not present

## 2020-01-31 ENCOUNTER — Other Ambulatory Visit: Payer: Medicare Other

## 2020-01-31 ENCOUNTER — Other Ambulatory Visit: Payer: Self-pay

## 2020-01-31 DIAGNOSIS — C61 Malignant neoplasm of prostate: Secondary | ICD-10-CM

## 2020-02-01 LAB — PSA: Prostate Specific Ag, Serum: 9.8 ng/mL — ABNORMAL HIGH (ref 0.0–4.0)

## 2020-02-05 ENCOUNTER — Encounter: Payer: Self-pay | Admitting: Urology

## 2020-02-05 ENCOUNTER — Other Ambulatory Visit: Payer: Self-pay

## 2020-02-05 ENCOUNTER — Ambulatory Visit: Payer: Medicare Other | Admitting: Urology

## 2020-02-05 ENCOUNTER — Ambulatory Visit (INDEPENDENT_AMBULATORY_CARE_PROVIDER_SITE_OTHER): Payer: Medicare Other | Admitting: Urology

## 2020-02-05 VITALS — BP 134/73 | HR 83 | Temp 98.2°F | Ht 65.0 in | Wt 171.0 lb

## 2020-02-05 DIAGNOSIS — C61 Malignant neoplasm of prostate: Secondary | ICD-10-CM | POA: Diagnosis not present

## 2020-02-05 LAB — MICROSCOPIC EXAMINATION
Bacteria, UA: NONE SEEN
Epithelial Cells (non renal): NONE SEEN /hpf (ref 0–10)
Renal Epithel, UA: NONE SEEN /hpf

## 2020-02-05 LAB — URINALYSIS, ROUTINE W REFLEX MICROSCOPIC
Bilirubin, UA: NEGATIVE
Glucose, UA: NEGATIVE
Ketones, UA: NEGATIVE
Nitrite, UA: NEGATIVE
Specific Gravity, UA: 1.02 (ref 1.005–1.030)
Urobilinogen, Ur: 0.2 mg/dL (ref 0.2–1.0)
pH, UA: 5.5 (ref 5.0–7.5)

## 2020-02-05 MED ORDER — SILDENAFIL CITRATE 100 MG PO TABS: 100.0000 mg | ORAL_TABLET | Freq: Every day | ORAL | 99 refills | Status: DC | PRN

## 2020-02-05 NOTE — Progress Notes (Signed)

## 2020-02-05 NOTE — Progress Notes (Signed)
H&P  Chief Complaint: Prostate Cancer  History of Present Illness:  9.21.2021: PSA - 9.8  Pt here for regularly scheduled f/u and continues under active surveillance. Pt denies any gross hematuria or LUTS.  (below copied from [facility] records):   He underwent TRUS/Bx by Dr. Glendell Docker in [place], [place] in early November, 2012. At that time, he had a small left sided prostate nodule and a PSA of 5.4. 1/12 biopsies were positive for adenocarcinoma --the right apical lateral biopsy revealed GS 3+3 in a small focus. The other 11 biopsies were all negative. He decided on active surveillance, and had a repeat surveillance biopsy in May, 2014. This revealed one core, on the right mid lateral prostate, with a GS 3+3 in 5% of the core. All other biopsies were benign. Prostatic volume was 65 cc.   8.6.2019: PSA 6.8 in February 2019.   2.6.2020: MRI of prostate. Volume was 66 mL. TUR defect was present. PI-RADS category 3 lesion in right lateral mid/apical prostate.   3.23.2020: Underwent fusion biopsy. All 3 cores from ROI were benign. 2/12 routine cores revealed GS 3+3 pattern--both with 5% of cores involved.   9.8.2020: Most recent PSA was 6.7 (on 8.31.2020). .   3.16.2021: Here today for follow-up. Most recent PSA 7.7.  Of note, within the last few weeks he was diagnosed with colon cancer -- this is pending treatment. This was originally found after a single episode of blood per stool. He has been evaluated per CT scan (3.5.2021 -- records available).   Kidney Stones:  The problem is on both sides. This is not his first kidney stone.   He has had eswl and ureteroscopy for treatment of his stones in the past.   He has a left-sided renal stone, 21 mm in size. He has had no significant flank pain recently.   2.5.2019: KUB performed in July, 2018. Revealed a stable 13 x 25 mm cluster of stones in the lower pole of the left kidney. No recent gross hematuria, dysuria, flank pain.    9.8.2020: He denies any hematuria, pain, or any other associated urinary sx's. He does note some occasional discomfort in his back that he attributes to his stones, but he tolerates this well.   4.2021: Stable LLP stone burden  Hematuria:  He first noticed the symptoms 09/07/2017.   He has had kidney stones.   Evaluated by Dr. Alyson Ingles on 4.25.2019 where clot evacuation/bladder irrigation was performed. He was treated with broad spectrum Abx. Urine c/s showed mixed growth. Symptoms actually began a few days earlier and he was seen in [place]. That catheter placed then eventually clotted off resulting in urology evaluation with Dr. Alyson Ingles. He was d/c'd on the 26th with an RX for [employer].  Past Medical History:  Diagnosis Date  . Cancer [facility])    prostate cancer  . Diabetes mellitus without complication ([place])   . History of kidney stones   . Hypertension     Past Surgical History:  Procedure Laterality Date  . CATARACT EXTRACTION W/PHACO Right 02/21/2017   Procedure: CATARACT EXTRACTION PHACO AND INTRAOCULAR LENS PLACEMENT ([facility]);  Surgeon: Tonny Branch, MD;  Location: [facility];  Service: Ophthalmology;  Laterality: Right;  CDE: 10.14  . CATARACT EXTRACTION W/PHACO Left 03/28/2017   Procedure: CATARACT EXTRACTION PHACO AND INTRAOCULAR LENS PLACEMENT ([facility]);  Surgeon: Tonny Branch, MD;  Location: [facility];  Service: Ophthalmology;  Laterality: Left;  CDE: 8.57  . LITHOTRIPSY     x3    Home Medications:  Allergies as of 02/05/2020   No Known Allergies     Medication List       Accurate as of February 05, 2020  1:15 PM. If you have any questions, ask your nurse or doctor.        amoxicillin 500 MG capsule Commonly known as: AMOXIL Take 500 mg by mouth 3 (three) times daily.   cefpodoxime 200 MG tablet Commonly known as: VANTIN Take 200 mg by mouth 2 (two) times daily.   clarithromycin 500 MG tablet Commonly known as: BIAXIN Take 500 mg by mouth 2 (two) times daily.    glipiZIDE 10 MG tablet Commonly known as: GLUCOTROL Take 10 mg by mouth daily.   losartan 50 MG tablet Commonly known as: COZAAR Take 50 mg by mouth daily.   metFORMIN 500 MG tablet Commonly known as: GLUCOPHAGE Take 500 mg by mouth 4 (four) times daily.   NIFEdipine 30 MG 24 hr tablet Commonly known as: PROCARDIA-XL/NIFEDICAL-XL Take 30 mg by mouth daily.   omeprazole 20 MG capsule Commonly known as: PRILOSEC Take 20 mg by mouth daily.   pantoprazole 40 MG tablet Commonly known as: PROTONIX Take 40 mg by mouth daily.   phenazopyridine 200 MG tablet Commonly known as: PYRIDIUM Take 1 tablet (200 mg total) by mouth 3 (three) times daily.       Allergies: No Known Allergies  No family history on file.  Social History:  reports that he quit smoking about 61 years ago. His smoking use included cigarettes. He has a 6.00 pack-year smoking history. He has never used smokeless tobacco. He reports that he does not drink alcohol and does not use drugs.  ROS: A complete review of systems was performed.  All systems are negative except for pertinent findings as noted.  Physical Exam:  Vital signs in last 24 hours: There were no vitals taken for this visit. Constitutional:  Alert and oriented, No acute distress Cardiovascular: Regular rate  Respiratory: Normal respiratory effort GI: Abdomen is soft, nontender, nondistended, no abdominal masses. No CVAT. No hernias Genitourinary: Uncircumcised male phallus, testes are descended bilaterally and non-tender and bilateral hydroceles (small), scrotum is normal in appearance without lesions or masses, perineum is normal on inspection. Prostate feels about 60 grams. Neurologic: Grossly intact, no focal deficits Psychiatric: Normal mood and affect  I have reviewed prior pt notes  I have reviewed notes from referring/previous physicians  I have reviewed urinalysis results  I have independently reviewed prior imaging  I have  reviewed prior PSA results   Impression/Assessment:  1. Image of renal calculus reviewed: stone remains stable in size and location and presents no concern at this time.  2. PCa--PSA recently elevated and will be redrawn in 4 months. Stable exam.  Plan:  1. Pt reassured regarding his most recent elevated PSA result oriented to standard active surveillance protocol: repeat PSA, then MRI, then Bx.  2. F/U in 4 months for OV and PSA  CC: Dr. Monico Blitz

## 2020-02-14 DIAGNOSIS — E119 Type 2 diabetes mellitus without complications: Secondary | ICD-10-CM | POA: Diagnosis not present

## 2020-02-14 DIAGNOSIS — I10 Essential (primary) hypertension: Secondary | ICD-10-CM | POA: Diagnosis not present

## 2020-03-14 DIAGNOSIS — E119 Type 2 diabetes mellitus without complications: Secondary | ICD-10-CM | POA: Diagnosis not present

## 2020-03-14 DIAGNOSIS — I10 Essential (primary) hypertension: Secondary | ICD-10-CM | POA: Diagnosis not present

## 2020-03-28 DIAGNOSIS — R350 Frequency of micturition: Secondary | ICD-10-CM | POA: Diagnosis not present

## 2020-03-28 DIAGNOSIS — I10 Essential (primary) hypertension: Secondary | ICD-10-CM | POA: Diagnosis not present

## 2020-03-28 DIAGNOSIS — Z299 Encounter for prophylactic measures, unspecified: Secondary | ICD-10-CM | POA: Diagnosis not present

## 2020-04-15 DIAGNOSIS — I10 Essential (primary) hypertension: Secondary | ICD-10-CM | POA: Diagnosis not present

## 2020-04-15 DIAGNOSIS — E119 Type 2 diabetes mellitus without complications: Secondary | ICD-10-CM | POA: Diagnosis not present

## 2020-04-30 DIAGNOSIS — C182 Malignant neoplasm of ascending colon: Secondary | ICD-10-CM | POA: Diagnosis not present

## 2020-04-30 DIAGNOSIS — Z85038 Personal history of other malignant neoplasm of large intestine: Secondary | ICD-10-CM | POA: Diagnosis not present

## 2020-05-15 ENCOUNTER — Telehealth: Payer: Self-pay | Admitting: Urology

## 2020-05-15 DIAGNOSIS — E119 Type 2 diabetes mellitus without complications: Secondary | ICD-10-CM | POA: Diagnosis not present

## 2020-05-15 DIAGNOSIS — I10 Essential (primary) hypertension: Secondary | ICD-10-CM | POA: Diagnosis not present

## 2020-05-15 NOTE — Telephone Encounter (Signed)
Pt called and asked if he can be seen next Tuesday 05/20/20. He has a scheduled appointment for 06/10/20. He asked that a nurse call him back regarding his issue.

## 2020-05-19 NOTE — Telephone Encounter (Signed)
Why not call to work in tomorrow pm

## 2020-05-20 ENCOUNTER — Encounter: Payer: Self-pay | Admitting: Urology

## 2020-05-20 ENCOUNTER — Other Ambulatory Visit: Payer: Self-pay

## 2020-05-20 ENCOUNTER — Telehealth: Payer: Self-pay

## 2020-05-20 ENCOUNTER — Other Ambulatory Visit: Payer: Self-pay | Admitting: Urology

## 2020-05-20 ENCOUNTER — Ambulatory Visit (INDEPENDENT_AMBULATORY_CARE_PROVIDER_SITE_OTHER): Payer: Medicare Other | Admitting: Urology

## 2020-05-20 VITALS — BP 130/61 | HR 83 | Temp 97.9°F | Ht 65.0 in | Wt 183.0 lb

## 2020-05-20 DIAGNOSIS — N200 Calculus of kidney: Secondary | ICD-10-CM

## 2020-05-20 DIAGNOSIS — C61 Malignant neoplasm of prostate: Secondary | ICD-10-CM

## 2020-05-20 NOTE — Progress Notes (Signed)

## 2020-05-20 NOTE — Telephone Encounter (Signed)
Front is calling to schedule him this afternoon.

## 2020-05-20 NOTE — Telephone Encounter (Signed)
[facility] is calling to schedule this afternoon.

## 2020-05-20 NOTE — Progress Notes (Signed)
History of Present Illness: Here for new onset left lower back pain.  This started 4 to 5 days ago.  He questions whether he saw a little bit of blood in his urine.  He does have left-sided renal calculi.  His pain is made a little bit worse by moving around.  Better by staying still.  He is concerned that this might be prostate cancer.  This is been followed with active surveillance.  Last PSA 9.8.  History of prostate cancer:  He underwent TRUS/Bx by Dr. Baldo Ash in [place], [place] in early November, 2012. At that time, he had a small left sided prostate nodule and a PSA of 5.4. 1/12 biopsies were positive for adenocarcinoma --the right apical lateral biopsy revealed GS 3+3 in a small focus. The other 11 biopsies were all negative. He decided on active surveillance, and had a repeat surveillance biopsy in May, 2014. This revealed one core, on the right mid lateral prostate, with a GS 3+3 in 5% of the core. All other biopsies were benign. Prostatic volume was 65 cc.   8.6.2019: PSA 6.8 in February 2019.   2.6.2020: MRI of prostate. Volume was 66 mL. TUR defect was present. PI-RADS category 3 lesion in right lateral mid/apical prostate.   3.23.2020: Underwent fusion biopsy. All 3 cores from ROI were benign. 2/12 routine cores revealed GS 3+3 pattern--both with 5% of cores involved.   9.8.2020: Most recent PSA was 6.7 (on 8.31.2020). .   3.16.2021:Here today for follow-up. Most recent PSA 7.7.  Of note, within the last few weeks he was diagnosed with colon cancer -- this is pending treatment. This was originally found after a single episode of blood per stool.He has been evaluated per CT scan(3.5.2021 -- records available).  9.21.2021: Recent PSA 9.8.   He also has a history of urolithiasis.  Most recent imaging for that was an abdominal film in July, 2019.  That revealed stable left-sided urolithiasis.  Past Medical History:  Diagnosis Date  . Cancer [facility])    prostate cancer  .  Diabetes mellitus without complication (HCC)   . History of kidney stones   . Hypertension     Past Surgical History:  Procedure Laterality Date  . CATARACT EXTRACTION W/PHACO Right 02/21/2017   Procedure: CATARACT EXTRACTION PHACO AND INTRAOCULAR LENS PLACEMENT (IOC);  Surgeon: Gemma Payor, MD;  Location: [facility];  Service: Ophthalmology;  Laterality: Right;  CDE: 10.14  . CATARACT EXTRACTION W/PHACO Left 03/28/2017   Procedure: CATARACT EXTRACTION PHACO AND INTRAOCULAR LENS PLACEMENT (IOC);  Surgeon: Gemma Payor, MD;  Location: [facility];  Service: Ophthalmology;  Laterality: Left;  CDE: 8.57  . LITHOTRIPSY     x3    Home Medications:  Allergies as of 05/20/2020   No Known Allergies     Medication List       Accurate as of May 20, 2020 12:27 PM. If you have any questions, ask your nurse or doctor.        amoxicillin 500 MG capsule Commonly known as: AMOXIL Take 500 mg by mouth 3 (three) times daily.   cefpodoxime 200 MG tablet Commonly known as: VANTIN Take 200 mg by mouth 2 (two) times daily.   clarithromycin 500 MG tablet Commonly known as: BIAXIN Take 500 mg by mouth 2 (two) times daily.   glipiZIDE 10 MG tablet Commonly known as: GLUCOTROL Take 10 mg by mouth daily.   losartan 50 MG tablet Commonly known as: COZAAR Take 50 mg by mouth daily.  metFORMIN 500 MG tablet Commonly known as: GLUCOPHAGE Take 500 mg by mouth 4 (four) times daily.   NIFEdipine 30 MG 24 hr tablet Commonly known as: PROCARDIA-XL/NIFEDICAL-XL Take 30 mg by mouth daily.   omeprazole 20 MG capsule Commonly known as: PRILOSEC Take 20 mg by mouth daily.   pantoprazole 40 MG tablet Commonly known as: PROTONIX Take 40 mg by mouth daily.   phenazopyridine 200 MG tablet Commonly known as: PYRIDIUM Take 1 tablet (200 mg total) by mouth 3 (three) times daily.   sildenafil 100 MG tablet Commonly known as: VIAGRA Take 1 tablet (100 mg total) by mouth daily as needed for erectile  dysfunction.       Allergies: No Known Allergies  No family history on file.  Social History:  reports that he quit smoking about 61 years ago. His smoking use included cigarettes. He has a 6.00 pack-year smoking history. He has never used smokeless tobacco. He reports that he does not drink alcohol and does not use drugs.  ROS: A complete review of systems was performed.  All systems are negative except for pertinent findings as noted.  Physical Exam:  Vital signs in last 24 hours: There were no vitals taken for this visit. Constitutional:  Alert and oriented, No acute distress Cardiovascular: Regular rate  Respiratory: Normal respiratory effort GI: Abdomen is soft, nontender, nondistended, no abdominal masses. No CVAT.  Lymphatic: No lymphadenopathy Neurologic: Grossly intact, no focal deficits Psychiatric: Normal mood and affect  I have reviewed prior pt notes  I have reviewed notes from referring/previous physicians  I have reviewed urinalysis results  I have independently reviewed prior imaging  I have reviewed prior PSA results    Impression/Assessment:  1.  History of low risk prostate cancer, on active surveillance.  Last PSA up slightly to 9.8.  I do not think his symptoms are due to that, however  2.  Left renal calculi, large.  He does have pain on that side  Plan:  1.  I will check his PSA today, I will also send him for CT stone protocol  2.  I will see back in 4 months following PSA

## 2020-05-21 LAB — PSA: Prostate Specific Ag, Serum: 7.9 ng/mL — ABNORMAL HIGH (ref 0.0–4.0)

## 2020-05-26 ENCOUNTER — Telehealth: Payer: Self-pay

## 2020-05-26 NOTE — Telephone Encounter (Signed)
Pt made aware of PSA results.

## 2020-05-26 NOTE — Telephone Encounter (Signed)
-----   Message from Franchot Gallo, MD sent at 05/26/2020  9:29 AM EST -----  Are the notify patient that PSA is lower, good news --7.9 ----- Message ----- From: Valentina Lucks, LPN Sent: 0/11/1217   4:13 PM EST To: Franchot Gallo, MD  Pls review.

## 2020-05-27 ENCOUNTER — Ambulatory Visit (HOSPITAL_COMMUNITY)
Admission: RE | Admit: 2020-05-27 | Discharge: 2020-05-27 | Disposition: A | Discharge status: Home / Self Care | Payer: Medicare Other | Source: Ambulatory Visit | Attending: Urology | Admitting: Urology

## 2020-05-27 ENCOUNTER — Other Ambulatory Visit: Payer: Self-pay

## 2020-05-27 DIAGNOSIS — N200 Calculus of kidney: Secondary | ICD-10-CM | POA: Diagnosis not present

## 2020-05-27 DIAGNOSIS — N281 Cyst of kidney, acquired: Secondary | ICD-10-CM | POA: Diagnosis not present

## 2020-05-27 DIAGNOSIS — I708 Atherosclerosis of other arteries: Secondary | ICD-10-CM | POA: Diagnosis not present

## 2020-05-29 ENCOUNTER — Telehealth: Payer: Self-pay

## 2020-05-29 NOTE — Telephone Encounter (Signed)
Pt called, no answer, message left to call office.

## 2020-05-29 NOTE — Telephone Encounter (Signed)
-----   Message from Franchot Gallo, MD sent at 05/28/2020  8:30 PM EST ----- Notify pt that CT showed stable left sided stone, no evidence of PCa ----- Message ----- From: Dorisann Frames, RN Sent: 05/27/2020   3:28 PM EST To: Franchot Gallo, MD  Please review

## 2020-05-30 NOTE — Telephone Encounter (Signed)
Pt called and made aware

## 2020-06-03 ENCOUNTER — Other Ambulatory Visit: Payer: Medicare Other

## 2020-06-10 ENCOUNTER — Ambulatory Visit: Payer: Medicare Other | Admitting: Urology

## 2020-06-16 DIAGNOSIS — E1165 Type 2 diabetes mellitus with hyperglycemia: Secondary | ICD-10-CM | POA: Diagnosis not present

## 2020-06-16 DIAGNOSIS — I10 Essential (primary) hypertension: Secondary | ICD-10-CM | POA: Diagnosis not present

## 2020-06-20 DIAGNOSIS — I700 Atherosclerosis of aorta: Secondary | ICD-10-CM | POA: Diagnosis not present

## 2020-06-20 DIAGNOSIS — C189 Malignant neoplasm of colon, unspecified: Secondary | ICD-10-CM | POA: Diagnosis not present

## 2020-06-20 DIAGNOSIS — Z85038 Personal history of other malignant neoplasm of large intestine: Secondary | ICD-10-CM | POA: Diagnosis not present

## 2020-06-20 DIAGNOSIS — I708 Atherosclerosis of other arteries: Secondary | ICD-10-CM | POA: Diagnosis not present

## 2020-06-20 DIAGNOSIS — N200 Calculus of kidney: Secondary | ICD-10-CM | POA: Diagnosis not present

## 2020-06-25 DIAGNOSIS — I10 Essential (primary) hypertension: Secondary | ICD-10-CM | POA: Diagnosis not present

## 2020-06-25 DIAGNOSIS — R809 Proteinuria, unspecified: Secondary | ICD-10-CM | POA: Diagnosis not present

## 2020-06-25 DIAGNOSIS — R21 Rash and other nonspecific skin eruption: Secondary | ICD-10-CM | POA: Diagnosis not present

## 2020-06-25 DIAGNOSIS — Z299 Encounter for prophylactic measures, unspecified: Secondary | ICD-10-CM | POA: Diagnosis not present

## 2020-06-25 DIAGNOSIS — E1129 Type 2 diabetes mellitus with other diabetic kidney complication: Secondary | ICD-10-CM | POA: Diagnosis not present

## 2020-06-25 DIAGNOSIS — Z87891 Personal history of nicotine dependence: Secondary | ICD-10-CM | POA: Diagnosis not present

## 2020-06-25 DIAGNOSIS — E1165 Type 2 diabetes mellitus with hyperglycemia: Secondary | ICD-10-CM | POA: Diagnosis not present

## 2020-07-07 DIAGNOSIS — Z01818 Encounter for other preprocedural examination: Secondary | ICD-10-CM | POA: Diagnosis not present

## 2020-07-10 DIAGNOSIS — I2510 Atherosclerotic heart disease of native coronary artery without angina pectoris: Secondary | ICD-10-CM | POA: Diagnosis not present

## 2020-07-10 DIAGNOSIS — K5730 Diverticulosis of large intestine without perforation or abscess without bleeding: Secondary | ICD-10-CM | POA: Diagnosis not present

## 2020-07-10 DIAGNOSIS — E119 Type 2 diabetes mellitus without complications: Secondary | ICD-10-CM | POA: Diagnosis not present

## 2020-07-10 DIAGNOSIS — Z08 Encounter for follow-up examination after completed treatment for malignant neoplasm: Secondary | ICD-10-CM | POA: Diagnosis not present

## 2020-07-10 DIAGNOSIS — K641 Second degree hemorrhoids: Secondary | ICD-10-CM | POA: Diagnosis not present

## 2020-07-10 DIAGNOSIS — K648 Other hemorrhoids: Secondary | ICD-10-CM | POA: Diagnosis not present

## 2020-07-10 DIAGNOSIS — Z7984 Long term (current) use of oral hypoglycemic drugs: Secondary | ICD-10-CM | POA: Diagnosis not present

## 2020-07-10 DIAGNOSIS — Z1211 Encounter for screening for malignant neoplasm of colon: Secondary | ICD-10-CM | POA: Diagnosis not present

## 2020-07-10 DIAGNOSIS — K644 Residual hemorrhoidal skin tags: Secondary | ICD-10-CM | POA: Diagnosis not present

## 2020-07-10 DIAGNOSIS — Z79899 Other long term (current) drug therapy: Secondary | ICD-10-CM | POA: Diagnosis not present

## 2020-07-10 DIAGNOSIS — Z9049 Acquired absence of other specified parts of digestive tract: Secondary | ICD-10-CM | POA: Diagnosis not present

## 2020-07-10 DIAGNOSIS — Z85038 Personal history of other malignant neoplasm of large intestine: Secondary | ICD-10-CM | POA: Diagnosis not present

## 2020-07-10 DIAGNOSIS — Z7982 Long term (current) use of aspirin: Secondary | ICD-10-CM | POA: Diagnosis not present

## 2020-07-10 DIAGNOSIS — D123 Benign neoplasm of transverse colon: Secondary | ICD-10-CM | POA: Diagnosis not present

## 2020-07-10 DIAGNOSIS — K635 Polyp of colon: Secondary | ICD-10-CM | POA: Diagnosis not present

## 2020-07-10 DIAGNOSIS — R6889 Other general symptoms and signs: Secondary | ICD-10-CM | POA: Diagnosis not present

## 2020-07-10 DIAGNOSIS — I10 Essential (primary) hypertension: Secondary | ICD-10-CM | POA: Diagnosis not present

## 2020-07-10 DIAGNOSIS — D126 Benign neoplasm of colon, unspecified: Secondary | ICD-10-CM | POA: Diagnosis not present

## 2020-07-23 DIAGNOSIS — D126 Benign neoplasm of colon, unspecified: Secondary | ICD-10-CM | POA: Diagnosis not present

## 2020-08-13 DIAGNOSIS — E1165 Type 2 diabetes mellitus with hyperglycemia: Secondary | ICD-10-CM | POA: Diagnosis not present

## 2020-08-13 DIAGNOSIS — I130 Hypertensive heart and chronic kidney disease with heart failure and stage 1 through stage 4 chronic kidney disease, or unspecified chronic kidney disease: Secondary | ICD-10-CM | POA: Diagnosis not present

## 2020-09-16 ENCOUNTER — Other Ambulatory Visit: Payer: Self-pay

## 2020-09-16 ENCOUNTER — Encounter: Payer: Self-pay | Admitting: Urology

## 2020-09-16 ENCOUNTER — Ambulatory Visit (INDEPENDENT_AMBULATORY_CARE_PROVIDER_SITE_OTHER): Payer: Medicare Other | Admitting: Urology

## 2020-09-16 ENCOUNTER — Other Ambulatory Visit: Payer: Self-pay | Admitting: Urology

## 2020-09-16 VITALS — BP 109/72 | HR 83

## 2020-09-16 DIAGNOSIS — C61 Malignant neoplasm of prostate: Secondary | ICD-10-CM

## 2020-09-16 DIAGNOSIS — N200 Calculus of kidney: Secondary | ICD-10-CM | POA: Diagnosis not present

## 2020-09-16 LAB — URINALYSIS, ROUTINE W REFLEX MICROSCOPIC
Bilirubin, UA: NEGATIVE
Glucose, UA: NEGATIVE
Ketones, UA: NEGATIVE
Leukocytes,UA: NEGATIVE
Nitrite, UA: NEGATIVE
Specific Gravity, UA: 1.025 (ref 1.005–1.030)
Urobilinogen, Ur: 0.2 mg/dL (ref 0.2–1.0)
pH, UA: 5.5 (ref 5.0–7.5)

## 2020-09-16 LAB — MICROSCOPIC EXAMINATION
Bacteria, UA: NONE SEEN
Renal Epithel, UA: NONE SEEN /hpf
WBC, UA: NONE SEEN /hpf (ref 0–5)

## 2020-09-16 NOTE — Progress Notes (Signed)

## 2020-09-16 NOTE — Progress Notes (Addendum)
History of Present Illness: Alexander Frank is here for followup of PCa as well as Lt sided urolithiasis.  PCa:  He underwent TRUS/Bx by Dr. Glendell Docker in [place], [place] in early November, 2012. At that time, he had a small left sided prostate nodule and a PSA of 5.4. 1/12 biopsies were positive for adenocarcinoma --the right apical lateral biopsy revealed GS 3+3 in a small focus. The other 11 biopsies were all negative. He decided on active surveillance, and had a repeat surveillance biopsy in May, 2014. This revealed one core, on the right mid lateral prostate, with a GS 3+3 in 5% of the core. All other biopsies were benign. Prostatic volume was 65 cc.   8.6.2019: PSA 6.8 in February 2019.   2.6.2020: MRI of prostate. Volume was 66 mL. TUR defect was present. PI-RADS category 3 lesion in right lateral mid/apical prostate.   3.23.2020: Underwent fusion biopsy. All 3 coresfrom ROI were benign. 2/12 routine cores revealed GS 3+3 pattern--both with 5% of cores involved.   9.8.2020:  PSA 6.7   3.16.2021: PSA 7.7  Of note, within the last few weeks he was diagnosed with colon cancer -- this is pending treatment. This was originally found after a single episode of blood per stool.He has been evaluated per CT scan(3.5.2021 -- records available).  9.21.2021: PSA 9.8. 1.4.2022: PSA 7.9  5.3.2022: He is here today for routine checkup.  No recent PSA.  No real complaints in the eating.  No blood in the urine or stool.   Urolithiasis:  He also has a history of urolithiasis.  Most recent imaging 1.11.2022---CT stone protocol--Stone in left lower pole appeared essentially the same size as several years before on prior CT. IMPRESSION: 1. Stable staghorn type calculi involving the lower pole region of the left kidney and 7 mm calculus in the midpole region of the right kidney. No obstructing ureteral calculi or bladder calculi. 2. Stable large upper pole left renal cyst. 3. No acute  abdominal/pelvic findings, mass lesions or adenopathy. 4. Enlarged prostate gland with median lobe hypertrophy impressing on the base of the bladder. 5. Advanced atherosclerotic calcifications involving the aorta and iliac arteries. 6. Aortic atherosclerosis.  5.3.2022: No complaints of flank pain or hematuria.    Past Medical History:  Diagnosis Date  . Cancer [facility])    prostate cancer  . Diabetes mellitus without complication ([place])   . History of kidney stones   . Hypertension     Past Surgical History:  Procedure Laterality Date  . CATARACT EXTRACTION W/PHACO Right 02/21/2017   Procedure: CATARACT EXTRACTION PHACO AND INTRAOCULAR LENS PLACEMENT (IOC);  Surgeon: Tonny Branch, MD;  Location: [facility];  Service: Ophthalmology;  Laterality: Right;  CDE: 10.14  . CATARACT EXTRACTION W/PHACO Left 03/28/2017   Procedure: CATARACT EXTRACTION PHACO AND INTRAOCULAR LENS PLACEMENT (IOC);  Surgeon: Tonny Branch, MD;  Location: [facility];  Service: Ophthalmology;  Laterality: Left;  CDE: 8.57  . LITHOTRIPSY     x3    Home Medications:  Allergies as of 09/16/2020   No Known Allergies     Medication List       Accurate as of Sep 16, 2020  7:55 AM. If you have any questions, ask your nurse or doctor.        amoxicillin 500 MG capsule Commonly known as: AMOXIL Take 500 mg by mouth 3 (three) times daily.   cefpodoxime 200 MG tablet Commonly known as: VANTIN Take 200 mg by mouth 2 (two) times daily.  clarithromycin 500 MG tablet Commonly known as: BIAXIN Take 500 mg by mouth 2 (two) times daily.   glipiZIDE 10 MG tablet Commonly known as: GLUCOTROL Take 10 mg by mouth daily.   losartan 50 MG tablet Commonly known as: COZAAR Take 50 mg by mouth daily.   metFORMIN 500 MG tablet Commonly known as: GLUCOPHAGE Take 500 mg by mouth 4 (four) times daily.   NIFEdipine 30 MG 24 hr tablet Commonly known as: PROCARDIA-XL/NIFEDICAL-XL Take 30 mg by mouth daily.   omeprazole 20 MG  capsule Commonly known as: PRILOSEC Take 20 mg by mouth daily.   pantoprazole 40 MG tablet Commonly known as: PROTONIX Take 40 mg by mouth daily.   phenazopyridine 200 MG tablet Commonly known as: PYRIDIUM Take 1 tablet (200 mg total) by mouth 3 (three) times daily.   sildenafil 100 MG tablet Commonly known as: VIAGRA Take 1 tablet (100 mg total) by mouth daily as needed for erectile dysfunction.       Allergies: No Known Allergies  No family history on file.  Social History:  reports that he quit smoking about 61 years ago. His smoking use included cigarettes. He has a 6.00 pack-year smoking history. He has never used smokeless tobacco. He reports that he does not drink alcohol and does not use drugs.  ROS: A complete review of systems was performed.  All systems are negative except for pertinent findings as noted.  Physical Exam:  Vital signs in last 24 hours: There were no vitals taken for this visit. Constitutional:  Alert and oriented, No acute distress Cardiovascular: Regular rate  Respiratory: Normal respiratory effort GI: Abdomen is soft, nontender, nondistended, no abdominal masses. No CVAT.  Genitourinary: Normal male phallus, testes are descended bilaterally and non-tender and without masses, scrotum is normal in appearance without lesions or masses, perineum is normal on inspection.  Prostate  40 g, symmetrical, nonnodular, Lymphatic: No lymphadenopathy Neurologic: Grossly intact, no focal deficits Psychiatric: Normal mood and affect  I have reviewed prior pt notes  I have reviewed notes from referring/previous physicians  I have reviewed urinalysis results  I have independently reviewed prior imaging-- stones similar in size between thousand 17 and 2022 CT scan.  I have reviewed prior PSA results   Impression/Assessment:  1.  Very low risk prostate cancer, on active surveillance with stable DRE/PSA an 87 year old male  2.  Urolithiasis, dominant left  lower pole, 17 mm in size, stable, asymptomatic  Plan:  I will check his PSA today  I will see back in about 4 months for repeat check.  Do not necessarily feel we needed Laurey Arrow CT left kidney stone unless significant symptomatic changes

## 2020-09-17 LAB — PSA: Prostate Specific Ag, Serum: 8.3 ng/mL — ABNORMAL HIGH (ref 0.0–4.0)

## 2020-09-23 ENCOUNTER — Telehealth: Payer: Self-pay

## 2020-09-23 NOTE — Telephone Encounter (Signed)
Patient notified of results.

## 2020-09-23 NOTE — Telephone Encounter (Signed)
-----   Message from Franchot Gallo, MD sent at 09/23/2020  2:26 PM EDT ----- Notify patient PSA is stable, give number ----- Message ----- From: Dorisann Frames, RN Sent: 09/17/2020   9:50 AM EDT To: Franchot Gallo, MD  Please review

## 2020-10-14 DIAGNOSIS — Z79899 Other long term (current) drug therapy: Secondary | ICD-10-CM | POA: Diagnosis not present

## 2020-10-14 DIAGNOSIS — Z0000 Encounter for general adult medical examination without abnormal findings: Secondary | ICD-10-CM | POA: Diagnosis not present

## 2020-10-14 DIAGNOSIS — Z299 Encounter for prophylactic measures, unspecified: Secondary | ICD-10-CM | POA: Diagnosis not present

## 2020-10-14 DIAGNOSIS — R52 Pain, unspecified: Secondary | ICD-10-CM | POA: Diagnosis not present

## 2020-10-14 DIAGNOSIS — E1165 Type 2 diabetes mellitus with hyperglycemia: Secondary | ICD-10-CM | POA: Diagnosis not present

## 2020-10-14 DIAGNOSIS — R5383 Other fatigue: Secondary | ICD-10-CM | POA: Diagnosis not present

## 2020-10-14 DIAGNOSIS — E1142 Type 2 diabetes mellitus with diabetic polyneuropathy: Secondary | ICD-10-CM | POA: Diagnosis not present

## 2020-10-14 DIAGNOSIS — Z7189 Other specified counseling: Secondary | ICD-10-CM | POA: Diagnosis not present

## 2020-10-14 DIAGNOSIS — E7800 Pure hypercholesterolemia, unspecified: Secondary | ICD-10-CM | POA: Diagnosis not present

## 2020-10-20 DIAGNOSIS — C182 Malignant neoplasm of ascending colon: Secondary | ICD-10-CM | POA: Diagnosis not present

## 2020-10-20 DIAGNOSIS — D126 Benign neoplasm of colon, unspecified: Secondary | ICD-10-CM | POA: Diagnosis not present

## 2020-11-13 DIAGNOSIS — E039 Hypothyroidism, unspecified: Secondary | ICD-10-CM | POA: Diagnosis not present

## 2020-11-13 DIAGNOSIS — J449 Chronic obstructive pulmonary disease, unspecified: Secondary | ICD-10-CM | POA: Diagnosis not present

## 2020-11-13 DIAGNOSIS — E7849 Other hyperlipidemia: Secondary | ICD-10-CM | POA: Diagnosis not present

## 2020-11-13 DIAGNOSIS — Z720 Tobacco use: Secondary | ICD-10-CM | POA: Diagnosis not present

## 2021-01-02 DIAGNOSIS — H43813 Vitreous degeneration, bilateral: Secondary | ICD-10-CM | POA: Diagnosis not present

## 2021-01-02 DIAGNOSIS — H524 Presbyopia: Secondary | ICD-10-CM | POA: Diagnosis not present

## 2021-01-15 DIAGNOSIS — M19011 Primary osteoarthritis, right shoulder: Secondary | ICD-10-CM | POA: Diagnosis not present

## 2021-01-15 DIAGNOSIS — I10 Essential (primary) hypertension: Secondary | ICD-10-CM | POA: Diagnosis not present

## 2021-01-15 DIAGNOSIS — Z299 Encounter for prophylactic measures, unspecified: Secondary | ICD-10-CM | POA: Diagnosis not present

## 2021-01-15 DIAGNOSIS — E1165 Type 2 diabetes mellitus with hyperglycemia: Secondary | ICD-10-CM | POA: Diagnosis not present

## 2021-01-20 ENCOUNTER — Ambulatory Visit: Payer: Medicare Other | Admitting: Urology

## 2021-01-20 ENCOUNTER — Encounter: Payer: Self-pay | Admitting: Urology

## 2021-01-20 ENCOUNTER — Other Ambulatory Visit: Payer: Self-pay

## 2021-01-20 VITALS — BP 116/69 | HR 98

## 2021-01-20 DIAGNOSIS — N401 Enlarged prostate with lower urinary tract symptoms: Secondary | ICD-10-CM

## 2021-01-20 DIAGNOSIS — N200 Calculus of kidney: Secondary | ICD-10-CM | POA: Diagnosis not present

## 2021-01-20 DIAGNOSIS — N138 Other obstructive and reflux uropathy: Secondary | ICD-10-CM | POA: Diagnosis not present

## 2021-01-20 DIAGNOSIS — C61 Malignant neoplasm of prostate: Secondary | ICD-10-CM

## 2021-01-20 LAB — URINALYSIS, ROUTINE W REFLEX MICROSCOPIC
Bilirubin, UA: NEGATIVE
Glucose, UA: NEGATIVE
Nitrite, UA: POSITIVE — AB
Specific Gravity, UA: 1.025 (ref 1.005–1.030)
Urobilinogen, Ur: 0.2 mg/dL (ref 0.2–1.0)
pH, UA: 5.5 (ref 5.0–7.5)

## 2021-01-20 LAB — MICROSCOPIC EXAMINATION
RBC, Urine: >30 /hpf — AB (ref 0–2)
Renal Epithel, UA: NONE SEEN /hpf
WBC, UA: >30 /hpf — AB (ref 0–5)

## 2021-01-20 MED ORDER — CEPHALEXIN 500 MG PO CAPS: 500.0000 mg | ORAL_CAPSULE | Freq: Two times a day (BID) | ORAL | 0 refills | Status: AC

## 2021-01-20 MED ORDER — ALFUZOSIN HCL ER 10 MG PO TB24: 10.0000 mg | ORAL_TABLET | Freq: Every day | ORAL | 11 refills | Status: DC

## 2021-01-20 NOTE — Addendum Note (Signed)
Addended by: Franchot Gallo on: 01/20/2021 02:02 PM   Modules accepted: Orders

## 2021-01-20 NOTE — Progress Notes (Signed)
Urological Symptom Review  Patient is experiencing the following symptoms: Frequent urination Hard to postpone urination Get up at night to urinate Leakage of urine Stream starts and stops Weak stream   Review of Systems  Gastrointestinal (upper)  : Negative for upper GI symptoms  Gastrointestinal (lower) : Negative for lower GI symptoms  Constitutional : Negative for symptoms  Skin: Negative for skin symptoms  Eyes: Negative for eye symptoms  Ear/Nose/Throat : Negative for Ear/Nose/Throat symptoms  Hematologic/Lymphatic: Negative for Hematologic/Lymphatic symptoms  Cardiovascular : Negative for cardiovascular symptoms  Respiratory : Negative for respiratory symptoms  Endocrine: Negative for endocrine symptoms  Musculoskeletal: Negative for musculoskeletal symptoms  Neurological: Negative for neurological symptoms  Psychologic: Negative for psychiatric symptoms

## 2021-01-20 NOTE — Progress Notes (Addendum)
History of Present Illness: Alexander Frank is here for followup of a h/o PCa as well as Lt sided urolithiasis.  PCa:   He underwent TRUS/Bx by Dr. Glendell Docker in [place], [place] in early November, 2012. At that time, he had a small left sided prostate nodule and a PSA of 5.4. 1/12 biopsies were positive for adenocarcinoma --the right apical lateral biopsy revealed GS 3+3 in a small focus. The other 11 biopsies were all negative. He decided on active surveillance, and had a repeat surveillance biopsy in May, 2014. This revealed one core, on the right mid lateral prostate, with a GS 3+3 in 5% of the core. All other biopsies were benign. Prostatic volume was 65 cc.    8.6.2019: PSA 6.8 in February 2019.    2.6.2020: MRI of prostate. Volume was 66 mL. TUR defect was present. PI-RADS category 3 lesion in right lateral mid/apical prostate.    3.23.2020: Underwent fusion biopsy. All 3 cores from ROI were benign. 2/12 routine cores revealed GS 3+3 pattern--both with 5% of cores involved.    9.8.2020:  PSA 6.7   3.16.2021:  PSA 7. Within the last few weeks he was diagnosed with colon cancer -- this is pending treatment. This was originally found after a single episode of blood per stool. He has been evaluated per CT scan (3.5.2021 -- records available).    9.21.2021: PSA 9.8. 1.4.2022: PSA 7.9 5.3.2022: PSA 8.3  9.6.2022: He does state that he has had increasing urinary frequency urgency and occasional urgency incontinence.  Slow stream.  IPSS 18.  He is not on any medication for BPH.  He has not had gross hematuria or dysuria.      Urolithiasis:   He also has a history of urolithiasis.  Most recent imaging 1.11.2022---CT stone protocol--Stone in left lower pole appeared essentially the same size as several years before on prior CT. IMPRESSION: 1. Stable staghorn type calculi involving the lower pole region of the left kidney and 7 mm calculus in the midpole region of the right kidney. No obstructing  ureteral calculi or bladder calculi. 2. Stable large upper pole left renal cyst. 3. No acute abdominal/pelvic findings, mass lesions or adenopathy. 4. Enlarged prostate gland with median lobe hypertrophy impressing on the base of the bladder. 5. Advanced atherosclerotic calcifications involving the aorta and iliac arteries. 6. Aortic atherosclerosis.   Past Medical History:  Diagnosis Date   Cancer [facility])    prostate cancer   Diabetes mellitus without complication ([place])    History of kidney stones    Hypertension     Past Surgical History:  Procedure Laterality Date   CATARACT EXTRACTION W/PHACO Right 02/21/2017   Procedure: CATARACT EXTRACTION PHACO AND INTRAOCULAR LENS PLACEMENT (IOC);  Surgeon: Tonny Branch, MD;  Location: [facility];  Service: Ophthalmology;  Laterality: Right;  CDE: 10.14   CATARACT EXTRACTION W/PHACO Left 03/28/2017   Procedure: CATARACT EXTRACTION PHACO AND INTRAOCULAR LENS PLACEMENT (IOC);  Surgeon: Tonny Branch, MD;  Location: [facility];  Service: Ophthalmology;  Laterality: Left;  CDE: 8.57   LITHOTRIPSY     x3    Home Medications:  Allergies as of 01/20/2021   No Known Allergies      Medication List        Accurate as of January 20, 2021  8:17 AM. If you have any questions, ask your nurse or doctor.          glipiZIDE 10 MG tablet Commonly known as: GLUCOTROL Take 10 mg by mouth  daily.   losartan 50 MG tablet Commonly known as: COZAAR Take 50 mg by mouth daily.   metFORMIN 500 MG tablet Commonly known as: GLUCOPHAGE Take 500 mg by mouth 4 (four) times daily.   NIFEdipine 30 MG 24 hr tablet Commonly known as: PROCARDIA-XL/NIFEDICAL-XL Take 30 mg by mouth daily.   omeprazole 20 MG capsule Commonly known as: PRILOSEC Take 20 mg by mouth daily.   pantoprazole 40 MG tablet Commonly known as: PROTONIX Take 40 mg by mouth daily.   phenazopyridine 200 MG tablet Commonly known as: PYRIDIUM Take 1 tablet (200 mg total) by mouth 3 (three)  times daily.   sildenafil 100 MG tablet Commonly known as: VIAGRA Take 1 tablet (100 mg total) by mouth daily as needed for erectile dysfunction.        Allergies: No Known Allergies  No family history on file.  Social History:  reports that he quit smoking about 61 years ago. His smoking use included cigarettes. He has a 6.00 pack-year smoking history. He has never used smokeless tobacco. He reports that he does not drink alcohol and does not use drugs.  ROS: A complete review of systems was performed.  All systems are negative except for pertinent findings as noted.  Physical Exam:  Vital signs in last 24 hours: There were no vitals taken for this visit. Constitutional:  Alert and oriented, No acute distress Cardiovascular: Regular rate  Respiratory: Normal respiratory effort Neurologic: Grossly intact, no focal deficits Psychiatric: Normal mood and affect  I have reviewed prior pt notes  I have reviewed urinalysis results  I have independently reviewed prior imaging  I have reviewed prior PSA results-PSA from today pending    Impression/Assessment:  1.  BPH with symptoms, bothersome currently.  Residual urine volume 1 mL  2.  Prostate cancer, on active surveillance with stable PSA trend  Plan:  1.  I will put him on alfuzosin  2.  Office visit in a couple of months to check symptoms  3.  PSA is checked today  Addendum: It did appear that his urine was infected.  We will culture his urine, send in appropriate antibiotic and follow-up as scheduled.

## 2021-01-21 DIAGNOSIS — C182 Malignant neoplasm of ascending colon: Secondary | ICD-10-CM | POA: Diagnosis not present

## 2021-01-21 DIAGNOSIS — D126 Benign neoplasm of colon, unspecified: Secondary | ICD-10-CM | POA: Diagnosis not present

## 2021-01-21 LAB — PSA: Prostate Specific Ag, Serum: 20.3 ng/mL — ABNORMAL HIGH (ref 0.0–4.0)

## 2021-01-23 LAB — URINE CULTURE

## 2021-01-26 ENCOUNTER — Telehealth: Payer: Self-pay

## 2021-01-26 ENCOUNTER — Other Ambulatory Visit: Payer: Self-pay | Admitting: Urology

## 2021-01-26 DIAGNOSIS — N3000 Acute cystitis without hematuria: Secondary | ICD-10-CM

## 2021-01-26 MED ORDER — SULFAMETHOXAZOLE-TRIMETHOPRIM 800-160 MG PO TABS
1.0000 | ORAL_TABLET | Freq: Two times a day (BID) | ORAL | 0 refills | Status: DC
Start: 2021-01-26 — End: 2021-07-21

## 2021-01-26 NOTE — Telephone Encounter (Signed)
-----   Message from Franchot Gallo, MD sent at 01/26/2021  6:34 AM EDT ----- Please call pt--psa up to 20--prob b/c of UTI that urine showed--I sent in abx for that (can't see where keflex was sent but I sent in 5 days of sulfa). Will recheck PSA @ next visit ----- Message ----- From: Dorisann Frames, RN Sent: 01/21/2021   9:17 AM EDT To: Franchot Gallo, MD  Please review

## 2021-01-26 NOTE — Telephone Encounter (Signed)
Patient wife called and notified of new prescription sent to pharmacy.

## 2021-02-13 DIAGNOSIS — I10 Essential (primary) hypertension: Secondary | ICD-10-CM | POA: Diagnosis not present

## 2021-02-13 DIAGNOSIS — E7800 Pure hypercholesterolemia, unspecified: Secondary | ICD-10-CM | POA: Diagnosis not present

## 2021-03-09 ENCOUNTER — Telehealth: Payer: Self-pay

## 2021-03-09 NOTE — Telephone Encounter (Signed)
Returned patient call. Patient reports he has had hematuria off and on over last 2 months. Patient reports same symptoms he saw Dr. Diona Fanti for at last office visit. Offered patient appt on next opening 03/17/2021. Patient declined.

## 2021-03-09 NOTE — Telephone Encounter (Signed)
Pt having a good amount of blood in urine. Wants to be seen on this Tue w/ Dahlstedt.  Please advise. Call back: [phone]  Thanks, Helene Kelp

## 2021-03-10 DIAGNOSIS — R350 Frequency of micturition: Secondary | ICD-10-CM | POA: Diagnosis not present

## 2021-03-10 DIAGNOSIS — M1990 Unspecified osteoarthritis, unspecified site: Secondary | ICD-10-CM | POA: Diagnosis not present

## 2021-03-10 DIAGNOSIS — Z299 Encounter for prophylactic measures, unspecified: Secondary | ICD-10-CM | POA: Diagnosis not present

## 2021-03-10 DIAGNOSIS — I10 Essential (primary) hypertension: Secondary | ICD-10-CM | POA: Diagnosis not present

## 2021-03-16 DIAGNOSIS — E7849 Other hyperlipidemia: Secondary | ICD-10-CM | POA: Diagnosis not present

## 2021-03-16 DIAGNOSIS — J449 Chronic obstructive pulmonary disease, unspecified: Secondary | ICD-10-CM | POA: Diagnosis not present

## 2021-03-16 DIAGNOSIS — Z720 Tobacco use: Secondary | ICD-10-CM | POA: Diagnosis not present

## 2021-03-16 DIAGNOSIS — E039 Hypothyroidism, unspecified: Secondary | ICD-10-CM | POA: Diagnosis not present

## 2021-03-24 ENCOUNTER — Other Ambulatory Visit: Payer: Self-pay

## 2021-03-24 ENCOUNTER — Encounter: Payer: Self-pay | Admitting: Urology

## 2021-03-24 ENCOUNTER — Ambulatory Visit (INDEPENDENT_AMBULATORY_CARE_PROVIDER_SITE_OTHER): Payer: Medicare Other | Admitting: Urology

## 2021-03-24 DIAGNOSIS — N3000 Acute cystitis without hematuria: Secondary | ICD-10-CM

## 2021-03-24 DIAGNOSIS — N401 Enlarged prostate with lower urinary tract symptoms: Secondary | ICD-10-CM | POA: Diagnosis not present

## 2021-03-24 DIAGNOSIS — N138 Other obstructive and reflux uropathy: Secondary | ICD-10-CM

## 2021-03-24 DIAGNOSIS — C61 Malignant neoplasm of prostate: Secondary | ICD-10-CM

## 2021-03-24 DIAGNOSIS — N200 Calculus of kidney: Secondary | ICD-10-CM | POA: Diagnosis not present

## 2021-03-24 LAB — MICROSCOPIC EXAMINATION
RBC, Urine: >30 /hpf — AB (ref 0–2)
Renal Epithel, UA: NONE SEEN /hpf
WBC, UA: NONE SEEN /hpf (ref 0–5)

## 2021-03-24 LAB — URINALYSIS, ROUTINE W REFLEX MICROSCOPIC
Bilirubin, UA: NEGATIVE
Glucose, UA: NEGATIVE
Ketones, UA: NEGATIVE
Leukocytes,UA: NEGATIVE
Nitrite, UA: NEGATIVE
Specific Gravity, UA: >1.03 — ABNORMAL HIGH (ref 1.005–1.030)
Urobilinogen, Ur: 0.2 mg/dL (ref 0.2–1.0)
pH, UA: 5.5 (ref 5.0–7.5)

## 2021-03-24 NOTE — Progress Notes (Signed)
Urological Symptom Review  Patient is experiencing the following symptoms: Blood in urine Urinary tract infection Weak stream   Review of Systems  Gastrointestinal (upper)  : Indigestion/heartburn Negative for upper GI symptoms  Gastrointestinal (lower) : Diarrhea  Constitutional : Negative for symptoms  Skin: Negative for skin symptoms  Eyes: Negative for eye symptoms  Ear/Nose/Throat : Negative for Ear/Nose/Throat symptoms  Hematologic/Lymphatic: Negative for Hematologic/Lymphatic symptoms  Cardiovascular : Negative for cardiovascular symptoms  Respiratory : Negative for respiratory symptoms  Endocrine: Negative for endocrine symptoms  Musculoskeletal: Back pain  Neurological: Negative for neurological symptoms  Psychologic: Negative for psychiatric symptoms

## 2021-03-24 NOTE — Progress Notes (Signed)
History of Present Illness:   PCa:   He underwent TRUS/Bx by Dr. Glendell Docker in [place], [place] in early November, 2012. At that time, he had a small left sided prostate nodule and a PSA of 5.4. 1/12 biopsies were positive for adenocarcinoma --the right apical lateral biopsy revealed GS 3+3 in a small focus. The other 11 biopsies were all negative. He decided on active surveillance, and had a repeat surveillance biopsy in May, 2014. This revealed one core, on the right mid lateral prostate, with a GS 3+3 in 5% of the core. All other biopsies were benign. Prostatic volume was 65 cc.    8.6.2019: PSA 6.8 in February 2019.    2.6.2020: MRI of prostate. Volume was 66 mL. TUR defect was present. PI-RADS category 3 lesion in right lateral mid/apical prostate.    3.23.2020: Underwent fusion biopsy. All 3 cores from ROI were benign. 2/12 routine cores revealed GS 3+3 pattern--both with 5% of cores involved.    9.8.2020:  PSA 6.7   3.16.2021:  PSA 7. Within the last few weeks he was diagnosed with colon cancer -- this is pending treatment. This was originally found after a single episode of blood per stool. He has been evaluated per CT scan (3.5.2021 -- records available).    9.21.2021: PSA 9.8. 1.4.2022: PSA 7.9 5.3.2022: PSA 8.3   9.6.2022: He does state that he has had increasing urinary frequency urgency and occasional urgency incontinence.  Slow stream.  IPSS 18.  He is not on any medication for BPH.  He has not had gross hematuria or dysuria.  He grew Klebsiella following that appointment and was treated appropriately with sulfa.  PSA was checked at this visit, without knowing he had an infection, and was 20.3  11.8.2022: Here for recheck.  He completed the sulfa medication.  A couple of weeks ago he had a short period of gross hematuria that was initial in nature.  Now, voiding better.  He is on alfuzosin which was started at his last visit.      Urolithiasis:   He also has a history of  urolithiasis.  Most recent imaging 1.11.2022---CT stone protocol--Stone in left lower pole appeared essentially the same size as several years before on prior CT. IMPRESSION: 1. Stable staghorn type calculi involving the lower pole region of the left kidney and 7 mm calculus in the midpole region of the right kidney. No obstructing ureteral calculi or bladder calculi. 2. Stable large upper pole left renal cyst. 3. No acute abdominal/pelvic findings, mass lesions or adenopathy. 4. Enlarged prostate gland with median lobe hypertrophy impressing on the base of the bladder. 5. Advanced atherosclerotic calcifications involving the aorta and iliac arteries. 6. Aortic atherosclerosis.  Past Medical History:  Diagnosis Date   Cancer [facility])    prostate cancer   Diabetes mellitus without complication ([place])    History of kidney stones    Hypertension     Past Surgical History:  Procedure Laterality Date   CATARACT EXTRACTION W/PHACO Right 02/21/2017   Procedure: CATARACT EXTRACTION PHACO AND INTRAOCULAR LENS PLACEMENT (IOC);  Surgeon: Tonny Branch, MD;  Location: [facility];  Service: Ophthalmology;  Laterality: Right;  CDE: 10.14   CATARACT EXTRACTION W/PHACO Left 03/28/2017   Procedure: CATARACT EXTRACTION PHACO AND INTRAOCULAR LENS PLACEMENT (IOC);  Surgeon: Tonny Branch, MD;  Location: [facility];  Service: Ophthalmology;  Laterality: Left;  CDE: 8.57   LITHOTRIPSY     x3    Home Medications:  Allergies as of 03/24/2021  No Known Allergies      Medication List        Accurate as of March 24, 2021  8:16 AM. If you have any questions, ask your nurse or doctor.          alfuzosin 10 MG 24 hr tablet Commonly known as: UROXATRAL Take 1 tablet (10 mg total) by mouth daily with breakfast.   glipiZIDE 10 MG tablet Commonly known as: GLUCOTROL Take 10 mg by mouth daily.   losartan 50 MG tablet Commonly known as: COZAAR Take 50 mg by mouth daily.   metFORMIN 500 MG tablet Commonly  known as: GLUCOPHAGE Take 500 mg by mouth 4 (four) times daily.   NIFEdipine 30 MG 24 hr tablet Commonly known as: PROCARDIA-XL/NIFEDICAL-XL Take 30 mg by mouth daily.   omeprazole 20 MG capsule Commonly known as: PRILOSEC Take 20 mg by mouth daily.   pantoprazole 40 MG tablet Commonly known as: PROTONIX Take 40 mg by mouth daily.   phenazopyridine 200 MG tablet Commonly known as: PYRIDIUM Take 1 tablet (200 mg total) by mouth 3 (three) times daily.   sildenafil 100 MG tablet Commonly known as: VIAGRA Take 1 tablet (100 mg total) by mouth daily as needed for erectile dysfunction.   sulfamethoxazole-trimethoprim 800-160 MG tablet Commonly known as: BACTRIM DS Take 1 tablet by mouth 2 (two) times daily.        Allergies: No Known Allergies  No family history on file.  Social History:  reports that he quit smoking about 62 years ago. His smoking use included cigarettes. He has a 6.00 pack-year smoking history. He has never used smokeless tobacco. He reports that he does not drink alcohol and does not use drugs.  ROS: A complete review of systems was performed.  All systems are negative except for pertinent findings as noted.  Physical Exam:  Vital signs in last 24 hours: There were no vitals taken for this visit. Constitutional:  Alert and oriented, No acute distress Cardiovascular: Regular rate  Respiratory: Normal respiratory effort Neurologic: Grossly intact, no focal deficits Psychiatric: Normal mood and affect  I have reviewed prior pt notes  I have reviewed urinalysis results  I have independently reviewed prior imaging  I have reviewed prior PSA results  I have reviewed prior urine culture   Impression/Assessment:  1.  Prostate cancer, on active surveillance.  Significant PSA bump at last visit but he had a concomitant urinary tract infection this is been treated  2.  BPH, symptoms better  3.  History of stable left renal calculi    Plan:  1.   I will check his PSA today  2.  It is okay to try to wean off the alfuzosin  3.  I will see back in 6 months

## 2021-03-25 LAB — PSA: Prostate Specific Ag, Serum: 10.7 ng/mL — ABNORMAL HIGH (ref 0.0–4.0)

## 2021-03-26 ENCOUNTER — Telehealth: Payer: Self-pay

## 2021-03-26 NOTE — Telephone Encounter (Signed)
-----   Message from Franchot Gallo, MD sent at 03/26/2021  8:33 AM EST ----- Notify pt--good news psa down to 10.7--elevation was probably d/t UTI ----- Message ----- From: Dorisann Frames, RN Sent: 03/25/2021   4:04 PM EST To: Franchot Gallo, MD  Please review

## 2021-03-26 NOTE — Telephone Encounter (Signed)
Patient called. Wife states patient is not home. Left message to have patient call our office.

## 2021-03-27 NOTE — Telephone Encounter (Signed)
Patient notified of results. Voiced understanding.

## 2021-04-20 DIAGNOSIS — I10 Essential (primary) hypertension: Secondary | ICD-10-CM | POA: Diagnosis not present

## 2021-04-20 DIAGNOSIS — E1142 Type 2 diabetes mellitus with diabetic polyneuropathy: Secondary | ICD-10-CM | POA: Diagnosis not present

## 2021-04-20 DIAGNOSIS — E1165 Type 2 diabetes mellitus with hyperglycemia: Secondary | ICD-10-CM | POA: Diagnosis not present

## 2021-04-20 DIAGNOSIS — Z299 Encounter for prophylactic measures, unspecified: Secondary | ICD-10-CM | POA: Diagnosis not present

## 2021-04-20 DIAGNOSIS — Z87891 Personal history of nicotine dependence: Secondary | ICD-10-CM | POA: Diagnosis not present

## 2021-04-22 DIAGNOSIS — Z9189 Other specified personal risk factors, not elsewhere classified: Secondary | ICD-10-CM | POA: Diagnosis not present

## 2021-04-22 DIAGNOSIS — D126 Benign neoplasm of colon, unspecified: Secondary | ICD-10-CM | POA: Diagnosis not present

## 2021-04-22 DIAGNOSIS — C182 Malignant neoplasm of ascending colon: Secondary | ICD-10-CM | POA: Diagnosis not present

## 2021-04-24 DIAGNOSIS — I10 Essential (primary) hypertension: Secondary | ICD-10-CM | POA: Diagnosis not present

## 2021-04-24 DIAGNOSIS — Z299 Encounter for prophylactic measures, unspecified: Secondary | ICD-10-CM | POA: Diagnosis not present

## 2021-04-24 DIAGNOSIS — N200 Calculus of kidney: Secondary | ICD-10-CM | POA: Diagnosis not present

## 2021-04-24 DIAGNOSIS — R319 Hematuria, unspecified: Secondary | ICD-10-CM | POA: Diagnosis not present

## 2021-05-26 DIAGNOSIS — E1165 Type 2 diabetes mellitus with hyperglycemia: Secondary | ICD-10-CM | POA: Diagnosis not present

## 2021-05-26 DIAGNOSIS — I10 Essential (primary) hypertension: Secondary | ICD-10-CM | POA: Diagnosis not present

## 2021-05-26 DIAGNOSIS — R319 Hematuria, unspecified: Secondary | ICD-10-CM | POA: Diagnosis not present

## 2021-05-26 DIAGNOSIS — Z299 Encounter for prophylactic measures, unspecified: Secondary | ICD-10-CM | POA: Diagnosis not present

## 2021-05-26 DIAGNOSIS — Z87891 Personal history of nicotine dependence: Secondary | ICD-10-CM | POA: Diagnosis not present

## 2021-05-29 DIAGNOSIS — Z85038 Personal history of other malignant neoplasm of large intestine: Secondary | ICD-10-CM | POA: Diagnosis not present

## 2021-05-29 DIAGNOSIS — D123 Benign neoplasm of transverse colon: Secondary | ICD-10-CM | POA: Diagnosis not present

## 2021-06-09 DIAGNOSIS — E1129 Type 2 diabetes mellitus with other diabetic kidney complication: Secondary | ICD-10-CM | POA: Diagnosis not present

## 2021-06-09 DIAGNOSIS — Z299 Encounter for prophylactic measures, unspecified: Secondary | ICD-10-CM | POA: Diagnosis not present

## 2021-06-09 DIAGNOSIS — I10 Essential (primary) hypertension: Secondary | ICD-10-CM | POA: Diagnosis not present

## 2021-06-09 DIAGNOSIS — E1165 Type 2 diabetes mellitus with hyperglycemia: Secondary | ICD-10-CM | POA: Diagnosis not present

## 2021-06-09 DIAGNOSIS — R319 Hematuria, unspecified: Secondary | ICD-10-CM | POA: Diagnosis not present

## 2021-06-09 DIAGNOSIS — R809 Proteinuria, unspecified: Secondary | ICD-10-CM | POA: Diagnosis not present

## 2021-06-10 DIAGNOSIS — R319 Hematuria, unspecified: Secondary | ICD-10-CM | POA: Diagnosis not present

## 2021-06-11 ENCOUNTER — Telehealth: Payer: Self-pay | Admitting: Gastroenterology

## 2021-06-11 NOTE — Telephone Encounter (Signed)
Hi Dr. Rush Landmark, we have received a referral from Dr. Michail Sermon with [facility] for removal of polyp. Records will be sent to you for review. Please advise on scheduling. Thank you.

## 2021-06-11 NOTE — Telephone Encounter (Signed)
I am in the office the rest of today and on Friday.  Hopefully I can reviewed by then. History of prior colon cancer status post resection from what I can see as well as a history of prostate cancer.  We will need to see where this possible polyp is located. Thanks. GM

## 2021-06-12 ENCOUNTER — Encounter: Payer: Self-pay | Admitting: Gastroenterology

## 2021-06-12 NOTE — Telephone Encounter (Signed)
Pt scheduled for ov on 07/21/21 at 11:10am.

## 2021-06-12 NOTE — Telephone Encounter (Signed)
Left message to call back.  Dr. Rush Landmark reviewed records and indicated that pt needs ov first to discuss.

## 2021-07-14 DIAGNOSIS — E1142 Type 2 diabetes mellitus with diabetic polyneuropathy: Secondary | ICD-10-CM | POA: Diagnosis not present

## 2021-07-14 DIAGNOSIS — Z87891 Personal history of nicotine dependence: Secondary | ICD-10-CM | POA: Diagnosis not present

## 2021-07-14 DIAGNOSIS — R3915 Urgency of urination: Secondary | ICD-10-CM | POA: Diagnosis not present

## 2021-07-14 DIAGNOSIS — I10 Essential (primary) hypertension: Secondary | ICD-10-CM | POA: Diagnosis not present

## 2021-07-14 DIAGNOSIS — E1165 Type 2 diabetes mellitus with hyperglycemia: Secondary | ICD-10-CM | POA: Diagnosis not present

## 2021-07-21 ENCOUNTER — Encounter: Payer: Self-pay | Admitting: Gastroenterology

## 2021-07-21 ENCOUNTER — Ambulatory Visit: Payer: Medicare Other | Admitting: Gastroenterology

## 2021-07-21 ENCOUNTER — Other Ambulatory Visit (INDEPENDENT_AMBULATORY_CARE_PROVIDER_SITE_OTHER): Payer: Medicare Other

## 2021-07-21 VITALS — Ht 65.0 in | Wt 170.8 lb

## 2021-07-21 DIAGNOSIS — Z85038 Personal history of other malignant neoplasm of large intestine: Secondary | ICD-10-CM | POA: Diagnosis not present

## 2021-07-21 DIAGNOSIS — D126 Benign neoplasm of colon, unspecified: Secondary | ICD-10-CM

## 2021-07-21 DIAGNOSIS — D508 Other iron deficiency anemias: Secondary | ICD-10-CM

## 2021-07-21 DIAGNOSIS — D649 Anemia, unspecified: Secondary | ICD-10-CM

## 2021-07-21 LAB — PROTIME-INR
INR: 1.2 ratio — ABNORMAL HIGH (ref 0.8–1.0)
Prothrombin Time: 12.9 s (ref 9.6–13.1)

## 2021-07-21 LAB — BASIC METABOLIC PANEL
BUN: 18 mg/dL (ref 6–23)
CO2: 29 mEq/L (ref 19–32)
Calcium: 9.6 mg/dL (ref 8.4–10.5)
Chloride: 103 mEq/L (ref 96–112)
Creatinine, Ser: 0.94 mg/dL (ref 0.40–1.50)
GFR: 73.96 mL/min (ref >60.00)
Glucose, Bld: 94 mg/dL (ref 70–99)
Potassium: 4.3 mEq/L (ref 3.5–5.1)
Sodium: 138 mEq/L (ref 135–145)

## 2021-07-21 LAB — CBC
HCT: 42.9 % (ref 39.0–52.0)
Hemoglobin: 14.6 g/dL (ref 13.0–17.0)
MCHC: 34.1 g/dL (ref 30.0–36.0)
MCV: 75.4 fl — ABNORMAL LOW (ref 78.0–100.0)
Platelets: 230 10*3/uL (ref 150.0–400.0)
RBC: 5.69 Mil/uL (ref 4.22–5.81)
RDW: 16.7 % — ABNORMAL HIGH (ref 11.5–15.5)
WBC: 5.4 10*3/uL (ref 4.0–10.5)

## 2021-07-21 MED ORDER — NA SULFATE-K SULFATE-MG SULF 17.5-3.13-1.6 GM/177ML PO SOLN: 1.0000 | Freq: Once | ORAL | 0 refills | Status: AC

## 2021-07-21 MED ORDER — ONDANSETRON HCL 4 MG PO TABS: ORAL_TABLET | ORAL | 0 refills | Status: AC

## 2021-07-21 NOTE — Patient Instructions (Addendum)
We have sent the following medications to your pharmacy for you to pick up at your convenience: ?Suprep ,Zofran  ? ?Your provider has requested that you go to the basement level for lab work before leaving today. Press "B" on the elevator. The lab is located at the first door on the left as you exit the elevator. ? ?You have been scheduled for a colonoscopy. Please follow written instructions given to you at your visit today.  ?Please pick up your prep supplies at the pharmacy within the next 1-3 days. ?If you use inhalers (even only as needed), please bring them with you on the day of your procedure. ? ?If you are age 12 or older, your body mass index should be between 23-30. Your Body mass index is 28.42 kg/m?Marland [place] If this is out of the aforementioned range listed, please consider follow up with your Primary Care Provider. ? ?The [facility] providers would like to encourage you to use [facility] to communicate with providers for non-urgent requests or questions.  Due to long hold times on the telephone, sending your provider a message by [facility] may be a faster and more efficient way to get a response.  Please allow 48 business hours for a response.  Please remember that this is for non-urgent requests.  ?_______________________________________________________ ? ?Thank you for choosing me and [facility]. ? ?Dr. Rush Landmark ? ?

## 2021-07-21 NOTE — Progress Notes (Signed)
[place] VISIT   Primary Care Provider Monico Blitz, [place] [address] [phone]  Referring Provider Dr. Michail Sermon  Patient Profile: Alexander Frank. is a 88 y.o. male with a pmh significant for colon cancer (status post right hemicolectomy), colon polyps (TVA of TC only partially removed in 2022), hypertension, diabetes, nephrolithiasis, prior prostate cancer.  The patient presents to the [facility] for an evaluation and management of problem(s) noted below:  Problem List 1. Tubulovillous adenoma polyp of colon   2. History of colon cancer   3. Other iron deficiency anemia     History of Present Illness This is the patient's first visit to the outpatient [facility].  He was diagnosed with colon cancer in 2021 and subsequently underwent a right hemicolectomy.  He underwent a follow-up colonoscopy with his surgeon in [place], [place] through [place] and was found to have a 15 mm polyp that required piecemeal partial resection but was not completely removed.  It returned as a tubulovillous adenoma.  The patient followed up with surgery later in the year and was referred to gastroenterology.  He was seen by Dr. Michail Sermon of [facility] who subsequently referred him to me to consider advanced resection techniques.  The patient is not experiencing any significant symptoms at this time from a GI perspective.  He denies abdominal pain and discomfort.  He is not having any alteration of his bowel habits or any blood in the stools.  He wonders whether any further procedures should even be considered at this point in his life.  His wife wants him to be as healthy as possible and expresses desire for repeat colonoscopy.  GI Review of Systems Positive as above Negative for pyrosis, dysphagia, odynophagia, nausea, vomiting, alteration of bowel habits  Review of Systems General: Denies fevers/chills/weight loss  unintentionally Cardiovascular: Denies chest pain Pulmonary: Denies shortness of breath Gastroenterological: See HPI Genitourinary: Denies darkened urine Hematological: Denies easy bruising/bleeding Dermatological: Denies jaundice Psychological: Mood is stable   Medications Current Outpatient Medications  Medication Sig Dispense Refill   losartan (COZAAR) 50 MG tablet Take 25 mg by mouth daily.  0   metFORMIN (GLUCOPHAGE) 500 MG tablet Take 500 mg by mouth in the morning and at bedtime.  5   NIFEdipine (PROCARDIA-XL/ADALAT-CC/NIFEDICAL-XL) 30 MG 24 hr tablet Take 30 mg by mouth daily.  6   ondansetron (ZOFRAN) 4 MG tablet Take 1 tablet by mouth 30 mins before starting preparation for colonoscopy. 3 tablet 0   No current facility-administered medications for this visit.    Allergies No Known Allergies  Histories Past Medical History:  Diagnosis Date   Cancer [facility])    prostate cancer   Diabetes mellitus without complication ([place])    History of kidney stones    Hypertension    Past Surgical History:  Procedure Laterality Date   CATARACT EXTRACTION W/PHACO Right 02/21/2017   Procedure: CATARACT EXTRACTION PHACO AND INTRAOCULAR LENS PLACEMENT ([place]);  Surgeon: Tonny Branch, MD;  Location: [facility];  Service: Ophthalmology;  Laterality: Right;  CDE: 10.14   CATARACT EXTRACTION W/PHACO Left 03/28/2017   Procedure: CATARACT EXTRACTION PHACO AND INTRAOCULAR LENS PLACEMENT (IOC);  Surgeon: Tonny Branch, MD;  Location: [facility];  Service: Ophthalmology;  Laterality: Left;  CDE: 8.57   COLON RESECTION  2021   LITHOTRIPSY     x3   Social History   Socioeconomic History   Marital status: Married    Spouse name: Not on file  Number of children: Not on file   Years of education: Not on file   Highest education level: Not on file  Occupational History   Not on file  Tobacco Use   Smoking status: Former    Packs/day: 0.50    Years: 12.00    Pack years: 6.00    Types: Cigarettes     Quit date: 02/17/1959    Years since quitting: 62.4   Smokeless tobacco: Never  Vaping Use   Vaping Use: Never used  Substance and Sexual Activity   Alcohol use: No   Drug use: No   Sexual activity: Yes    Birth control/protection: None  Other Topics Concern   Not on file  Social History Narrative   Not on file   Social Determinants of Health   Financial Resource Strain: Not on file  Food Insecurity: Not on file  Transportation Needs: Not on file  Physical Activity: Not on file  Stress: Not on file  Social Connections: Not on file  Intimate Partner Violence: Not on file   Family History  Problem Relation Age of Onset   Cancer - Lung Brother        2 brothers   Prostate cancer Brother    Colon cancer Neg Hx    Stomach cancer Neg Hx    Esophageal cancer Neg Hx    Inflammatory bowel disease Neg Hx    Liver disease Neg Hx    Pancreatic cancer Neg Hx    Rectal cancer Neg Hx    I have reviewed his medical, social, and family history in detail and updated the electronic medical record as necessary.    PHYSICAL EXAMINATION  Ht '5\' 5"'$  (1.651 m)    Wt 170 lb 12.8 oz (77.5 kg)    BMI 28.42 kg/m  Wt Readings from Last 3 Encounters:  07/21/21 170 lb 12.8 oz (77.5 kg)  05/20/20 183 lb (83 kg)  02/05/20 171 lb (77.6 kg)  GEN: NAD, appears younger than stated age, doesn't appear chronically ill, accompanied by wife PSYCH: Cooperative, without pressured speech EYE: Conjunctivae pink, sclerae anicteric ENT: MMM CV: Nontachycardic RESP: No audible wheezing GI: NABS, soft, surgical scars present, NT/ND, without rebound or guarding MSK/EXT: No lower extremity edema SKIN: No jaundice NEURO:  Alert & Oriented x 3, no focal deficits   REVIEW OF DATA  I reviewed the following data at the time of this encounter:  GI Procedures and Studies  2/22 Colonoscopy Findings:       Hemorrhoids were found on perianal exam.       The exam was otherwise without abnormality on direct and  retroflexion       views.       The perianal and digital rectal examinations were normal.       A 15 mm polypoid lesion was found in the proximal transverse colon. The       lesion was sessile. No bleeding was present. The polyp was removed with       a hot snare and The polyp was removed with a piecemeal technique using a       hot snare. Polyp resection was incomplete. The resected tissue was       retrieved. Verification of patient identification for the specimen was       done by the nurse using the patient's name and medical record number.       Estimated blood loss was minimal. Area was tattooed with an injection of  4 mL of Spot (carbon black). To prevent bleeding after the polypectomy,       one hemostatic clip was successfully placed. There was no bleeding at       the end of the procedure.       The exam was otherwise without abnormality on direct and retroflexion       views.   Pathology Consistent with a TVA  Laboratory Studies  Reviewed those in epic and care everywhere  Imaging Studies  2/22 CTAP (outside) 1. Stable surgical changes involving the cecal region. No findings  for recurrent tumor, regional lymphadenopathy or metastatic disease.  2. Stable bilateral renal calculi.  3. Stable enlarged prostate gland with median lobe hypertrophy  impressing on the base of the bladder.  4. Aortic atherosclerosis.   Aortic Atherosclerosis (ICD10-I70.0).     ASSESSMENT  Mr. Ainsley is a 88 y.o. male with a pmh significant for colon cancer (status post right hemicolectomy), colon polyps (TVA of TC only partially removed in 2022), hypertension, diabetes, nephrolithiasis, prior prostate cancer.  The patient is seen today for evaluation and management of:  1. Tubulovillous adenoma polyp of colon   2. History of colon cancer   3. Other iron deficiency anemia    The patient is clinically and hemodynamically stable.  Based upon the description and endoscopic pictures I do  feel that it is reasonable to pursue an Advanced Polypectomy attempt of the polyp/lesion.  We discussed some of the techniques of advanced polypectomy which include Endoscopic Mucosal Resection, OVESCO Full-Thickness Resection, Endorotor Morcellation, and Tissue Ablation via Fulguration.  We also reviewed images of typical techniques as noted above.  The risks and benefits of endoscopic evaluation were discussed with the patient; these include but are not limited to the risk of perforation, infection, bleeding, missed lesions, lack of diagnosis, severe illness requiring hospitalization, as well as anesthesia and sedation related illnesses.  During attempts at advanced resection, the risks of bleeding and perforation/leak are increased as opposed to diagnostic and screening procedures, and that was discussed with the patient as well.   In addition, I explained that with the possible need for piecemeal resection, subsequent short-interval endoscopic evaluation for follow up and potential retreatment of the lesion/area may be necessary.  I did offer, a referral to surgery in order for patient to have opportunity to discuss surgical management/intervention prior to finalizing decision for attempt at endoscopic removal, however, the patient deferred on this.  If, after attempt at removal of the polyp/lesion, it is found that the patient has a complication or that an invasive lesion or malignant lesion is found, or that the polyp/lesion continues to recur, the patient is aware and understands that surgery may still be indicated/required.  The patient has evidence of iron deficiency anemia without clear etiology and is receiving Feraheme infusions.  I recommend an upper endoscopy be performed at the same time as his colonoscopy.  They agreed to this plan for now but will let me know at the time of his procedure if he wants to undergo the EGD or not.  All patient questions were answered, to the best of my ability, and the  patient agrees to the aforementioned plan of action with follow-up as indicated.   PLAN  Proceed with scheduling colonoscopy with EMR of partially removed TVA Proceed with scheduling endoscopy for evaluation of iron deficiency anemia Consider additional work-up based on findings above Preprocedure labs to be obtained as well as iron indices   Orders Placed This  Encounter  Procedures   Procedural/ Surgical Case Request: COLONOSCOPY WITH PROPOFOL, ENDOSCOPIC MUCOSAL RESECTION   CBC   Basic Metabolic Panel (BMET)   INR/PT   IBC + Ferritin   Ambulatory referral to Gastroenterology    New Prescriptions   ONDANSETRON (ZOFRAN) 4 MG TABLET    Take 1 tablet by mouth 30 mins before starting preparation for colonoscopy.   Modified Medications   No medications on file    Planned Follow Up No follow-ups on file.   Total Time in Face-to-Face and in Coordination of Care for patient including independent/personal interpretation/review of prior testing, medical history, examination, medication adjustment, communicating results with the patient directly, and documentation within the EHR is 45 minutes.   Justice Britain, MD [facility] Advanced Endoscopy Office # [phone]

## 2021-07-22 ENCOUNTER — Encounter: Payer: Self-pay | Admitting: Gastroenterology

## 2021-07-22 ENCOUNTER — Other Ambulatory Visit (INDEPENDENT_AMBULATORY_CARE_PROVIDER_SITE_OTHER): Payer: Medicare Other

## 2021-07-22 DIAGNOSIS — Z85038 Personal history of other malignant neoplasm of large intestine: Secondary | ICD-10-CM

## 2021-07-22 DIAGNOSIS — D649 Anemia, unspecified: Secondary | ICD-10-CM

## 2021-07-22 LAB — IBC + FERRITIN
Ferritin: 15.5 ng/mL — ABNORMAL LOW (ref 22.0–322.0)
Iron: 120 ug/dL (ref 42–165)
Saturation Ratios: 29 % (ref 20.0–50.0)
TIBC: 414.4 ug/dL (ref 250.0–450.0)
Transferrin: 296 mg/dL (ref 212.0–360.0)

## 2021-07-24 DIAGNOSIS — D649 Anemia, unspecified: Secondary | ICD-10-CM | POA: Insufficient documentation

## 2021-07-24 DIAGNOSIS — D126 Benign neoplasm of colon, unspecified: Secondary | ICD-10-CM | POA: Insufficient documentation

## 2021-07-24 DIAGNOSIS — I10 Essential (primary) hypertension: Secondary | ICD-10-CM | POA: Diagnosis not present

## 2021-07-24 DIAGNOSIS — Z85038 Personal history of other malignant neoplasm of large intestine: Secondary | ICD-10-CM | POA: Insufficient documentation

## 2021-07-24 DIAGNOSIS — Z299 Encounter for prophylactic measures, unspecified: Secondary | ICD-10-CM | POA: Diagnosis not present

## 2021-07-24 DIAGNOSIS — Z713 Dietary counseling and surveillance: Secondary | ICD-10-CM | POA: Diagnosis not present

## 2021-07-24 DIAGNOSIS — E1165 Type 2 diabetes mellitus with hyperglycemia: Secondary | ICD-10-CM | POA: Diagnosis not present

## 2021-07-28 ENCOUNTER — Telehealth: Payer: Self-pay | Admitting: Gastroenterology

## 2021-07-28 DIAGNOSIS — D508 Other iron deficiency anemias: Secondary | ICD-10-CM

## 2021-07-28 DIAGNOSIS — Z85038 Personal history of other malignant neoplasm of large intestine: Secondary | ICD-10-CM

## 2021-07-28 DIAGNOSIS — D126 Benign neoplasm of colon, unspecified: Secondary | ICD-10-CM

## 2021-07-28 MED ORDER — FERROUS GLUCONATE 324 (38 FE) MG PO TABS: 324.0000 mg | ORAL_TABLET | Freq: Every day | ORAL | 2 refills | Status: AC

## 2021-07-28 NOTE — Telephone Encounter (Signed)
Patient spouse is returning your call.  ?

## 2021-07-28 NOTE — Telephone Encounter (Signed)
Patients wife was informed of labs results. Pt will start Ferrous Gluconate '324mg'$  - once daily. Wife understands that patient will need to hold iron 5 days prior to his procedure. EGD has been added to his colonoscopy that is scheduled for 09/24/21 at [facility].  ?

## 2021-09-17 ENCOUNTER — Encounter (HOSPITAL_COMMUNITY): Payer: Self-pay | Admitting: Gastroenterology

## 2021-09-24 ENCOUNTER — Ambulatory Visit (HOSPITAL_COMMUNITY)
Admission: RE | Admit: 2021-09-24 | Discharge: 2021-09-24 | Disposition: A | Discharge status: Home / Self Care | Payer: Medicare Other | Attending: Gastroenterology | Admitting: Gastroenterology

## 2021-09-24 ENCOUNTER — Ambulatory Visit (HOSPITAL_BASED_OUTPATIENT_CLINIC_OR_DEPARTMENT_OTHER): Payer: Medicare Other | Admitting: Anesthesiology

## 2021-09-24 ENCOUNTER — Encounter (HOSPITAL_COMMUNITY)
Admission: RE | Disposition: A | Discharge status: Home / Self Care | Payer: Self-pay | Source: Home / Self Care | Attending: Gastroenterology

## 2021-09-24 ENCOUNTER — Encounter (HOSPITAL_COMMUNITY): Payer: Self-pay | Admitting: Gastroenterology

## 2021-09-24 ENCOUNTER — Ambulatory Visit (HOSPITAL_COMMUNITY): Payer: Medicare Other | Admitting: Anesthesiology

## 2021-09-24 ENCOUNTER — Other Ambulatory Visit: Payer: Self-pay

## 2021-09-24 DIAGNOSIS — K2270 Barrett's esophagus without dysplasia: Secondary | ICD-10-CM | POA: Insufficient documentation

## 2021-09-24 DIAGNOSIS — K3189 Other diseases of stomach and duodenum: Secondary | ICD-10-CM

## 2021-09-24 DIAGNOSIS — Z08 Encounter for follow-up examination after completed treatment for malignant neoplasm: Secondary | ICD-10-CM

## 2021-09-24 DIAGNOSIS — Z85038 Personal history of other malignant neoplasm of large intestine: Secondary | ICD-10-CM | POA: Insufficient documentation

## 2021-09-24 DIAGNOSIS — D123 Benign neoplasm of transverse colon: Secondary | ICD-10-CM | POA: Insufficient documentation

## 2021-09-24 DIAGNOSIS — D509 Iron deficiency anemia, unspecified: Secondary | ICD-10-CM | POA: Diagnosis not present

## 2021-09-24 DIAGNOSIS — Z9049 Acquired absence of other specified parts of digestive tract: Secondary | ICD-10-CM | POA: Diagnosis not present

## 2021-09-24 DIAGNOSIS — Z980 Intestinal bypass and anastomosis status: Secondary | ICD-10-CM | POA: Insufficient documentation

## 2021-09-24 DIAGNOSIS — K641 Second degree hemorrhoids: Secondary | ICD-10-CM | POA: Insufficient documentation

## 2021-09-24 DIAGNOSIS — Z87891 Personal history of nicotine dependence: Secondary | ICD-10-CM | POA: Diagnosis not present

## 2021-09-24 DIAGNOSIS — Z4802 Encounter for removal of sutures: Secondary | ICD-10-CM

## 2021-09-24 DIAGNOSIS — K2289 Other specified disease of esophagus: Secondary | ICD-10-CM | POA: Diagnosis not present

## 2021-09-24 DIAGNOSIS — E119 Type 2 diabetes mellitus without complications: Secondary | ICD-10-CM | POA: Insufficient documentation

## 2021-09-24 DIAGNOSIS — K644 Residual hemorrhoidal skin tags: Secondary | ICD-10-CM | POA: Insufficient documentation

## 2021-09-24 DIAGNOSIS — K635 Polyp of colon: Secondary | ICD-10-CM | POA: Diagnosis not present

## 2021-09-24 DIAGNOSIS — I10 Essential (primary) hypertension: Secondary | ICD-10-CM | POA: Diagnosis not present

## 2021-09-24 DIAGNOSIS — D122 Benign neoplasm of ascending colon: Secondary | ICD-10-CM | POA: Diagnosis not present

## 2021-09-24 DIAGNOSIS — K2950 Unspecified chronic gastritis without bleeding: Secondary | ICD-10-CM | POA: Diagnosis not present

## 2021-09-24 DIAGNOSIS — Z7984 Long term (current) use of oral hypoglycemic drugs: Secondary | ICD-10-CM | POA: Diagnosis not present

## 2021-09-24 DIAGNOSIS — K6289 Other specified diseases of anus and rectum: Secondary | ICD-10-CM | POA: Diagnosis not present

## 2021-09-24 HISTORY — PX: ENDOSCOPIC MUCOSAL RESECTION: SHX6839

## 2021-09-24 HISTORY — PX: ESOPHAGOGASTRODUODENOSCOPY (EGD) WITH PROPOFOL: SHX5813

## 2021-09-24 HISTORY — PX: COLONOSCOPY WITH PROPOFOL: SHX5780

## 2021-09-24 HISTORY — PX: POLYPECTOMY: SHX5525

## 2021-09-24 HISTORY — PX: BIOPSY: SHX5522

## 2021-09-24 HISTORY — PX: SUBMUCOSAL LIFTING INJECTION: SHX6855

## 2021-09-24 HISTORY — PX: HEMOSTASIS CLIP PLACEMENT: SHX6857

## 2021-09-24 HISTORY — PX: FOREIGN BODY REMOVAL: SHX962

## 2021-09-24 LAB — GLUCOSE, CAPILLARY
Glucose-Capillary: 90 mg/dL (ref 70–99)
Glucose-Capillary: 101 mg/dL — ABNORMAL HIGH (ref 70–99)

## 2021-09-24 SURGERY — COLONOSCOPY WITH PROPOFOL
Anesthesia: Monitor Anesthesia Care

## 2021-09-24 MED ORDER — LIDOCAINE 2% (20 MG/ML) 5 ML SYRINGE
INTRAMUSCULAR | Status: DC | PRN
Start: 2021-09-24 — End: 2021-09-24
  Administered 2021-09-24: 100 mg via INTRAVENOUS

## 2021-09-24 MED ORDER — LACTATED RINGERS IV SOLN: INTRAVENOUS | Status: DC | PRN

## 2021-09-24 MED ORDER — SODIUM CHLORIDE 0.9 % IV SOLN: INTRAVENOUS | Status: DC

## 2021-09-24 MED ORDER — PROPOFOL 10 MG/ML IV BOLUS
INTRAVENOUS | Status: DC | PRN
  Administered 2021-09-24: 50 mg via INTRAVENOUS

## 2021-09-24 MED ORDER — PHENYLEPHRINE 80 MCG/ML (10ML) SYRINGE FOR IV PUSH (FOR BLOOD PRESSURE SUPPORT)
PREFILLED_SYRINGE | INTRAVENOUS | Status: DC | PRN
  Administered 2021-09-24 (×3): 80 ug via INTRAVENOUS

## 2021-09-24 MED ORDER — PROPOFOL 500 MG/50ML IV EMUL
INTRAVENOUS | Status: DC | PRN
  Administered 2021-09-24: 125 ug/kg/min via INTRAVENOUS

## 2021-09-24 SURGICAL SUPPLY — 25 items

## 2021-09-24 NOTE — Op Note (Signed)
[facility] ?Patient Name: Alexander Frank ?Procedure Date : 09/24/2021 ?MRN: [id number] ?Attending MD: Gabriel Mansouraty , MD ?Date of Birth: 12/11/1933 ?CSN: [id number] ?Age: 87 ?Admit Type: Outpatient ?Procedure:                Colonoscopy ?Indications:              Excision of colonic polyp, Personal history of  ?                          malignant neoplasm of the colon ?Providers:                Gabriel Mansouraty, MD, Hayleigh Westmoreland, RN,  ?                          Erik Holloway Technician ?Referring MD:             Ashish C. Shah MD, MD, Vincent C. Schooler, MD, Dr.  ?                          Cathey ?Medicines:                Monitored Anesthesia Care ?Complications:            No immediate complications. ?Estimated Blood Loss:     Estimated blood loss was minimal. ?Procedure:                Pre-Anesthesia Assessment: ?                          - Prior to the procedure, a History and Physical  ?                          was performed, and patient medications and  ?                          allergies were reviewed. The patient's tolerance of  ?                          previous anesthesia was also reviewed. The risks  ?                          and benefits of the procedure and the sedation  ?                          options and risks were discussed with the patient.  ?                          All questions were answered, and informed consent  ?                          was obtained. Prior Anticoagulants: The patient has  ?                          taken no previous anticoagulant or antiplatelet  ?                            agents. ASA Grade Assessment: III - A patient with  ?                          severe systemic disease. After reviewing the risks  ?                          and benefits, the patient was deemed in  ?                          satisfactory condition to undergo the procedure. ?                          After obtaining informed consent, the colonoscope  ?                           was passed under direct vision. Throughout the  ?                          procedure, the patient's blood pressure, pulse, and  ?                          oxygen saturations were monitored continuously. The  ?                          [id number]-HQ190L ([id number] Olympus peds colonscope was  ?                          introduced through the anus and advanced to the the  ?                          ileocolonic anastomosis. The colonoscopy was  ?                          somewhat difficult. Successful completion of the  ?                          procedure was aided by performing the maneuvers  ?                          documented (below) in this report. The patient  ?                          tolerated the procedure. The quality of the bowel  ?                          preparation was adequate. ?Scope In: 10:38:23 AM ?Scope Out: 11:29:05 AM ?Scope Withdrawal Time: 0 hours 42 minutes 40 seconds  ?Total Procedure Duration: 0 hours 50 minutes 42 seconds  ?Findings: ?     The digital rectal exam findings include hemorrhoids. Pertinent  ?     negatives include no palpable rectal lesions. ?     There was evidence of a prior functional end-to-end ileo-colonic  ?     anastomosis in the ascending colon. This was patent and was  ?       characterized by an intact staples. The anastomosis was traversed as  ?     noted below into the neo-terminal ileum. Removal of 5 staples was  ?     accomplished with a regular forceps. ?     The neo-terminal ileum appeared normal. ?     A 6 mm polyp was found in the proximal transverse colon. The polyp was  ?     sessile. The polyp was removed with a cold snare. Resection and  ?     retrieval were complete. ?     A 30 mm polypoid lesion, found within a region of scar, with tattoo  ?     unfortunately completely underneath the lesion was found in the proximal  ?     ascending colon. The lesion was semi-sessile but evidence of fibrosis  ?     was present from prior resection attempt. Preparations  were made for  ?     mucosal resection. NBI imaging and White-light endoscopy was done to  ?     demarcate the borders of the lesion. Everlift was injected to raise the  ?     lesion with mild success Piecemeal mucosal resection using a snare was  ?     performed. We tried underwater resection to float the polyp tissue with  ?     some further success. However, resection was incomplete with a snare.  ?     The resected tissue was retrieved. Fulguration to ablate the lesion  ?     remnants by forcep avulsion was successful. Fulguration to ablate the  ?     lesion by snare tip soft coagulation to the margin was successful. To  ?     prevent bleeding after mucosal resection, four hemostatic clips were  ?     successfully placed (MR conditional). There was no bleeding at the end  ?     of the procedure. ?     Normal mucosa was found in the entire colon otherwise. ?     Non-bleeding non-thrombosed external and internal hemorrhoids were found  ?     during retroflexion, during perianal exam and during digital exam. The  ?     hemorrhoids were Grade II (internal hemorrhoids that prolapse but reduce  ?     spontaneously). ?Impression:               - Hemorrhoids found on digital rectal exam. ?                          - Patent functional end-to-end ileo-colonic  ?                          anastomosis, characterized by an intact staples -  ?                          they were removed. ?                          - The neo-terminal ileum was normal. ?                          - One 6 mm polyp in the proximal transverse colon,  ?  removed with a cold snare. Resected and retrieved. ?                          - One 30 mm polyp with evidence of scar being  ?                          present as well as fibrosis and tattoo completely  ?                          underneath it, was found in the proximal ascending  ?                          colon, removed with piecemeal mucosal resection and  ?                           forcep avulsion. STSC performed to margin. Resected  ?                          tissue retrieved. Clips (MR conditional) were  ?                          placed. ?                          - Normal mucosa in the entire examined colon  ?                          otherwise. ?                          - Non-bleeding non-thrombosed external and internal  ?                          hemorrhoids. ?Recommendation:           - The patient will be observed post-procedure,  ?                          until all discharge criteria are met. ?                          - Discharge patient to home. ?                          - Patient has a contact number available for  ?                          emergencies. The signs and symptoms of potential  ?                          delayed complications were discussed with the  ?                          patient. Return to normal activities tomorrow.  ?  Written discharge instructions were provided to the  ?                          patient. ?                          - High fiber diet. ?                          - Use FiberCon 1-2 tablets PO daily. ?                          - No aspirin, ibuprofen, naproxen, or other  ?                          non-steroidal anti-inflammatory drugs for 2 weeks  ?                          after polyp removal. ?                          - Continue present medications. ?                          - Await pathology results. ?                          - Repeat colonoscopy within next 6-9 months for  ?                          surveillance. ?                          - The findings and recommendations were discussed  ?                          with the patient. ?                          - The findings and recommendations were discussed  ?                          with the patient's family. ?Procedure Code(s):        --- Professional --- ?                          45390, Colonoscopy, flexible; with endoscopic  ?                           mucosal resection ?                          45385, 59, Colonoscopy, flexible; with removal of  ?                          tumor(s), polyp(s), or other lesion(s) by snare  ?

## 2021-09-24 NOTE — Transfer of Care (Signed)
Immediate Anesthesia Transfer of Care Note ? ?Patient: Alexander Frank. ? ?Procedure(s) Performed: COLONOSCOPY WITH PROPOFOL ?ENDOSCOPIC MUCOSAL RESECTION ?ESOPHAGOGASTRODUODENOSCOPY (EGD) WITH PROPOFOL ?BIOPSY ?FOREIGN BODY REMOVAL ?POLYPECTOMY ?SUBMUCOSAL LIFTING INJECTION ?HEMOSTASIS CLIP PLACEMENT ? ?Patient Location: PACU ? ?Anesthesia Type:MAC ? ?Level of Consciousness: drowsy ? ?Airway & Oxygen Therapy: Patient Spontanous Breathing and Patient connected to nasal cannula oxygen ? ?Post-op Assessment: Report given to RN and Post -op Vital signs reviewed and stable ? ?Post vital signs: Reviewed and stable ? ?Last Vitals:  ?Vitals Value Taken Time  ?BP 134/67 09/24/21 1136  ?Temp    ?Pulse 66 09/24/21 1136  ?Resp 12 09/24/21 1136  ?SpO2 96 % 09/24/21 1136  ?Vitals shown include unvalidated device data. ? ?Last Pain:  ?Vitals:  ? 09/24/21 1004  ?TempSrc: Temporal  ?PainSc: 0-No pain  ?   ? ?  ? ?Complications: No notable events documented. ?

## 2021-09-24 NOTE — Op Note (Signed)
[facility] ?Patient Name: Alexander Frank ?Procedure Date : 09/24/2021 ?MRN: [id number] ?Attending MD: Justice Britain , MD ?Date of Birth: 1934/04/06 ?CSN: [id number] ?Age: 88 ?Admit Type: Outpatient ?Procedure:                Upper GI endoscopy ?Indications:              Iron deficiency anemia ?Providers:                Justice Britain, MD, Jeanella Cara, RN,  ?                          Tyna Jaksch Technician ?Referring MD:             Fuller Canada. Manuella Ghazi MD, MD, Dr. Ladona Horns ?Medicines:                Monitored Anesthesia Care ?Complications:            No immediate complications. ?Estimated Blood Loss:     Estimated blood loss was minimal. ?Procedure:                Pre-Anesthesia Assessment: ?                          - Prior to the procedure, a History and Physical  ?                          was performed, and patient medications and  ?                          allergies were reviewed. The patient's tolerance of  ?                          previous anesthesia was also reviewed. The risks  ?                          and benefits of the procedure and the sedation  ?                          options and risks were discussed with the patient.  ?                          All questions were answered, and informed consent  ?                          was obtained. Prior Anticoagulants: The patient has  ?                          taken no previous anticoagulant or antiplatelet  ?                          agents. ASA Grade Assessment: III - A patient with  ?                          severe systemic disease. After reviewing the risks  ?  and benefits, the patient was deemed in  ?                          satisfactory condition to undergo the procedure. ?                          After obtaining informed consent, the endoscope was  ?                          passed under direct vision. Throughout the  ?                          procedure, the patient's blood pressure, pulse,  and  ?                          oxygen saturations were monitored continuously. The  ?                          GIF-H190 ([id number]) Olympus endoscope was introduced  ?                          through the mouth, and advanced to the second part  ?                          of duodenum. The upper GI endoscopy was  ?                          accomplished without difficulty. The patient  ?                          tolerated the procedure. ?Scope In: ?Scope Out: ?Findings: ?     No gross lesions were noted in the proximal esophagus and in the mid  ?     esophagus. ?     One tongue of salmon-colored mucosa was present from 37 to 40 cm. No  ?     other visible abnormalities were present. Biopsies were taken with a  ?     cold forceps for histology. ?     The Z-line was irregular and was found 40 cm from the incisors. ?     Patchy mildly erythematous mucosa without bleeding was found in the  ?     entire examined stomach. Biopsies were taken with a cold forceps for  ?     histology and Helicobacter pylori testing. ?     No gross lesions were noted in the duodenal bulb, in the first portion  ?     of the duodenum and in the second portion of the duodenum. Biopsies were  ?     taken with a cold forceps for histology. ?Impression:               - No gross lesions in esophagus proximally.  ?                          Salmon-colored mucosa suspicious for short-segment  ?  Barrett's esophagus distally - biopsied. ?                          - Z-line irregular, 40 cm from the incisors. ?                          - Erythematous mucosa in the stomach. Biopsied. ?                          - No gross lesions in the duodenal bulb, in the  ?                          first portion of the duodenum and in the second  ?                          portion of the duodenum. Biopsied. ?Recommendation:           - Proceed to scheduled colonoscopy. ?                          - Continue present medications. ?                           - Await pathology results. ?                          - Repeat upper endoscopy for surveillance based on  ?                          pathology results. ?                          - The findings and recommendations were discussed  ?                          with the patient. ?                          - The findings and recommendations were discussed  ?                          with the patient's family. ?Procedure Code(s):        --- Professional --- ?                          [phone], Esophagogastroduodenoscopy, flexible,  ?                          transoral; with biopsy, single or multiple ?Diagnosis Code(s):        --- Professional --- ?                          K22.8, Other specified diseases of esophagus ?                          K31.89, Other diseases of stomach and duodenum ?  D50.9, Iron deficiency anemia, unspecified ?CPT copyright 2019 [employer]. All rights reserved. ?The codes documented in this report are preliminary and upon coder review may  ?be revised to meet current compliance requirements. ?Justice Britain, MD ?09/24/2021 11:45:50 AM ?Number of Addenda: 0 ?

## 2021-09-24 NOTE — Anesthesia Preprocedure Evaluation (Signed)
Anesthesia Evaluation  ?Patient identified by MRN, date of birth, ID band ?Patient awake ? ? ? ?Reviewed: ?Allergy & Precautions, NPO status , Patient's Chart, lab work & pertinent test results ? ?Airway ?Mallampati: II ? ?TM Distance: >3 FB ?Neck ROM: Full ? ? ? Dental ? ?(+) Missing ?  ?Pulmonary ?former smoker,  ?  ?breath sounds clear to auscultation ? ? ? ? ? ? Cardiovascular ?hypertension, Pt. on medications ? ?Rhythm:Regular Rate:Normal ? ? ?  ?Neuro/Psych ?negative neurological ROS ? negative psych ROS  ? GI/Hepatic ?negative GI ROS, Neg liver ROS,   ?Endo/Other  ?diabetes, Type 2, Oral Hypoglycemic Agents ? Renal/GU ?negative Renal ROS  ? ?  ?Musculoskeletal ?negative musculoskeletal ROS ?(+)  ? Abdominal ?Normal abdominal exam  (+)   ?Peds ? Hematology ?  ?Anesthesia Other Findings ? ? Reproductive/Obstetrics ? ?  ? ? ? ? ? ? ? ? ? ? ? ? ? ?  ?  ? ? ? ? ? ? ? ? ?Anesthesia Physical ?Anesthesia Plan ? ?ASA: 2 ? ?Anesthesia Plan: MAC  ? ?Post-op Pain Management:   ? ?Induction: Intravenous ? ?PONV Risk Score and Plan: 0 and Propofol infusion ? ?Airway Management Planned: Natural Airway and Simple Face Mask ? ?Additional Equipment: None ? ?Intra-op Plan:  ? ?Post-operative Plan:  ? ?Informed Consent:  ? ?Plan Discussed with: CRNA ? ?Anesthesia Plan Comments:   ? ? ? ? ? ? ?Anesthesia Quick Evaluation ? ?

## 2021-09-24 NOTE — Anesthesia Postprocedure Evaluation (Signed)
Anesthesia Post Note ? ?Patient: Rhythm Wigfall. ? ?Procedure(s) Performed: COLONOSCOPY WITH PROPOFOL ?ENDOSCOPIC MUCOSAL RESECTION ?ESOPHAGOGASTRODUODENOSCOPY (EGD) WITH PROPOFOL ?BIOPSY ?FOREIGN BODY REMOVAL ?POLYPECTOMY ?SUBMUCOSAL LIFTING INJECTION ?HEMOSTASIS CLIP PLACEMENT ? ?  ? ?Patient location during evaluation: PACU ?Anesthesia Type: MAC ?Level of consciousness: awake and alert ?Pain management: pain level controlled ?Vital Signs Assessment: post-procedure vital signs reviewed and stable ?Respiratory status: spontaneous breathing and respiratory function stable ?Cardiovascular status: stable ?Postop Assessment: no apparent nausea or vomiting ?Anesthetic complications: no ? ? ?No notable events documented. ? ?Last Vitals:  ?Vitals:  ? 09/24/21 1135 09/24/21 1150  ?BP: 134/67 (!) 149/74  ?Pulse: 66 63  ?Resp: 12 15  ?Temp: 36.4 ?C 36.4 ?C  ?SpO2: 97% 99%  ?  ?Last Pain:  ?Vitals:  ? 09/24/21 1150  ?TempSrc:   ?PainSc: 0-No pain  ? ? ?  ?  ?  ?  ?  ?  ? ?Billiejean Schimek DANIEL ? ? ? ? ?

## 2021-09-24 NOTE — H&P (Signed)
? ?GASTROENTEROLOGY PROCEDURE H&P NOTE  ? ?Primary Care Physician: ?Monico Blitz, MD ? ?HPI: ?Hardie Veltre. is a 88 y.o. 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 ? ?Past Medical History:  ?Diagnosis Date  ? Cancer [facility])   ? prostate cancer  ? Diabetes mellitus without complication ([place])   ? History of kidney stones   ? Hypertension   ? ?Past Surgical History:  ?Procedure Laterality Date  ? CATARACT EXTRACTION W/PHACO Right 02/21/2017  ? Procedure: CATARACT EXTRACTION PHACO AND INTRAOCULAR LENS PLACEMENT (IOC);  Surgeon: Tonny Branch, MD;  Location: [facility];  Service: Ophthalmology;  Laterality: Right;  CDE: 10.14  ? CATARACT EXTRACTION W/PHACO Left 03/28/2017  ? Procedure: CATARACT EXTRACTION PHACO AND INTRAOCULAR LENS PLACEMENT (IOC);  Surgeon: Tonny Branch, MD;  Location: [facility];  Service: Ophthalmology;  Laterality: Left;  CDE: 8.57  ? COLON RESECTION  2021  ? LITHOTRIPSY    ? x3  ? ?Current Facility-Administered Medications  ?Medication Dose Route Frequency Provider Last Rate Last Admin  ? 0.9 %  sodium chloride infusion   Intravenous Continuous Mansouraty, Telford Nab., MD      ? ? ?Current Facility-Administered Medications:  ?  0.9 %  sodium chloride infusion, , Intravenous, Continuous, Mansouraty, Telford Nab., MD ?No Known Allergies ?Family History  ?Problem Relation Age of Onset  ? Cancer - Lung Brother   ?     2 brothers  ? Prostate cancer Brother   ? Colon cancer Neg Hx   ? Stomach cancer Neg Hx   ? Esophageal cancer Neg Hx   ? Inflammatory bowel disease Neg Hx   ? Liver disease Neg Hx   ? Pancreatic cancer Neg Hx   ? Rectal cancer Neg Hx   ? ?Social History  ? ?Socioeconomic History  ? Marital status: Married  ?  Spouse name: Not on file  ? Number of children: Not on file  ?  Years of education: Not on file  ? Highest education level: Not on file  ?Occupational History  ? Not on file  ?Tobacco Use  ? Smoking status: Former  ?  Packs/day: 0.50  ?  Years: 12.00  ?  Pack years: 6.00  ?  Types: Cigarettes  ?  Quit date: 02/17/1959  ?  Years since quitting: 62.6  ? Smokeless tobacco: Never  ?Vaping Use  ? Vaping Use: Never used  ?Substance and Sexual Activity  ? Alcohol use: No  ? Drug use: No  ? Sexual activity: Yes  ?  Birth control/protection: None  ?Other Topics Concern  ? Not on file  ?Social History Narrative  ? Not on file  ? ?Social Determinants of Health  ? ?Financial Resource Strain: Not on file  ?Food Insecurity: Not on file  ?Transportation Needs: Not on file  ?Physical Activity: Not on file  ?Stress: Not on file  ?Social Connections: Not on file  ?Intimate Partner Violence: Not on file  ? ? ?Physical Exam: ?There were no vitals filed for this visit. ?There is no height or weight on file to calculate BMI. ?GEN: NAD ?EYE: Sclerae anicteric ?ENT: MMM ?CV: Non-tachycardic ?GI: Soft, NT/ND ?NEURO:  Alert & Oriented x 3 ? ?Lab Results: ?No results for input(s): WBC, HGB, HCT, PLT in the last 72 hours. ?BMET ?No results for input(s): NA, K, CL, CO2, GLUCOSE,  BUN, CREATININE, CALCIUM in the last 72 hours. ?LFT ?No results for input(s): PROT, ALBUMIN, AST, ALT, ALKPHOS, BILITOT, BILIDIR, IBILI in the last 72 hours. ?PT/INR ?No results for input(s): LABPROT, INR in the last 72 hours. ? ? 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 ? ?The risks and benefits of endoscopic evaluation/treatment were discussed with the patient and/or family; these include but are not limited to the risk of perforation, infection, bleeding, missed  lesions, lack of diagnosis, severe illness requiring hospitalization, as well as anesthesia and sedation related illnesses.  The patient's history has been reviewed, patient examined, no change in status, and deemed stable for procedure.  The patient and/or family is agreeable to proceed.  ? ? ?Justice Britain, MD ?[facility] ?Advanced Endoscopy ?Office # [phone] ? ?

## 2021-09-25 ENCOUNTER — Encounter: Payer: Self-pay | Admitting: Gastroenterology

## 2021-09-25 LAB — SURGICAL PATHOLOGY

## 2021-09-29 ENCOUNTER — Encounter: Payer: Self-pay | Admitting: Urology

## 2021-09-29 ENCOUNTER — Ambulatory Visit: Payer: Medicare Other | Admitting: Urology

## 2021-09-29 VITALS — BP 112/61 | HR 79

## 2021-09-29 DIAGNOSIS — Z8546 Personal history of malignant neoplasm of prostate: Secondary | ICD-10-CM

## 2021-09-29 DIAGNOSIS — N138 Other obstructive and reflux uropathy: Secondary | ICD-10-CM

## 2021-09-29 DIAGNOSIS — N401 Enlarged prostate with lower urinary tract symptoms: Secondary | ICD-10-CM

## 2021-09-29 DIAGNOSIS — C61 Malignant neoplasm of prostate: Secondary | ICD-10-CM

## 2021-09-29 NOTE — Progress Notes (Signed)
?History of Present Illness:  ? ?PCa: ?  ?He underwent TRUS/Bx by Dr. Glendell Docker in [place], [place] in early November, 2012. At that time, he had a small left sided prostate nodule and a PSA of 5.4. 1/12 biopsies were positive for adenocarcinoma --the right apical lateral biopsy revealed GS 3+3 in a small focus. The other 11 biopsies were all negative. He decided on active surveillance, and had a repeat surveillance biopsy in May, 2014. This revealed one core, on the right mid lateral prostate, with a GS 3+3 in 5% of the core. All other biopsies were benign. Prostatic volume was 65 cc.  ?  ?8.6.2019: PSA 6.8 in February 2019.  ?  ?2.6.2020: MRI of prostate. Volume was 66 mL. TUR defect was present. PI-RADS category 3 lesion in right lateral mid/apical prostate.  ?  ?3.23.2020: Underwent fusion biopsy. All 3 cores from ROI were benign. 2/12 routine cores revealed GS 3+3 pattern--both with 5% of cores involved.  ?  ?9.8.2020:  PSA 6.7   ?3.16.2021:  PSA 7. Within the last few weeks he was diagnosed with colon cancer -- this is pending treatment. This was originally found after a single episode of blood per stool. He has been evaluated per CT scan (3.5.2021 -- records available).  ?  ?9.21.2021: PSA 9.8. ?1.4.2022: PSA 7.9 ?5.3.2022: PSA 8.3 ? ?  ?9.6.2022: He does state that he has had increasing urinary frequency urgency and occasional urgency incontinence.  Slow stream.  IPSS 18.  He is not on any medication for BPH.  He has not had gross hematuria or dysuria.  He grew Klebsiella following that appointment and was treated appropriately with sulfa. PSA was checked at this visit, without knowing he had an infection, and was 20.3 ? ?11.8.2022:PSA 10.7 ? ?5.16.2023: No real issues since his last visit here 6 months ago.  No blood in his urine, no gross hematuria.  No recent PSA. ?  ?  ? Urolithiasis: ?  ?He also has a history of urolithiasis.  Most recent imaging 1.11.2022---CT stone protocol--Stone in left lower pole  appeared essentially the same size as several years before on prior CT. ?IMPRESSION: ?1. Stable staghorn type calculi involving the lower pole region of ?the left kidney and 7 mm calculus in the midpole region of the right ?kidney. No obstructing ureteral calculi or bladder calculi. ?2. Stable large upper pole left renal cyst. ?3. No acute abdominal/pelvic findings, mass lesions or adenopathy. ?4. Enlarged prostate gland with median lobe hypertrophy impressing ?on the base of the bladder. ?5. Advanced atherosclerotic calcifications involving the aorta and ?iliac arteries. ?6. Aortic atherosclerosis. ? ?5.16.2023: No recent gross hematuria or flank pain. ? ?Past Medical History:  ?Diagnosis Date  ? Cancer [facility])   ? prostate cancer  ? Diabetes mellitus without complication ([place])   ? History of kidney stones   ? Hypertension   ? ? ?Past Surgical History:  ?Procedure Laterality Date  ? BIOPSY  09/24/2021  ? Procedure: BIOPSY;  Surgeon: Irving Copas., MD;  Location: [place];  Service: Gastroenterology;;  ? CATARACT EXTRACTION W/PHACO Right 02/21/2017  ? Procedure: CATARACT EXTRACTION PHACO AND INTRAOCULAR LENS PLACEMENT (IOC);  Surgeon: Tonny Branch, MD;  [facility]: [facility];  Service: Ophthalmology;  Laterality: Right;  CDE: 10.14  ? CATARACT EXTRACTION W/PHACO Left 03/28/2017  ? Procedure: CATARACT EXTRACTION PHACO AND INTRAOCULAR LENS PLACEMENT (IOC);  Surgeon: Tonny Branch, MD;  Location: [facility];  Service: Ophthalmology;  Laterality: Left;  CDE: 8.57  ? COLON RESECTION  2021  ? COLONOSCOPY WITH PROPOFOL N/A 09/24/2021  ? Procedure: COLONOSCOPY WITH PROPOFOL;  Surgeon: Mansouraty, Telford Nab., MD;  Location: [place];  Service: Gastroenterology;  Laterality: N/A;  ? ENDOSCOPIC MUCOSAL RESECTION N/A 09/24/2021  ? Procedure: ENDOSCOPIC MUCOSAL RESECTION;  Surgeon: Rush Landmark Telford Nab., MD;  Location: [place];  Service: Gastroenterology;  Laterality: N/A;  ? ESOPHAGOGASTRODUODENOSCOPY (EGD) WITH  PROPOFOL N/A 09/24/2021  ? Procedure: ESOPHAGOGASTRODUODENOSCOPY (EGD) WITH PROPOFOL;  Surgeon: Rush Landmark Telford Nab., MD;  Location: [place];  Service: Gastroenterology;  Laterality: N/A;  ? FOREIGN BODY REMOVAL  09/24/2021  ? Procedure: FOREIGN BODY REMOVAL;  Surgeon: Rush Landmark Telford Nab., MD;  Location: [place];  Service: Gastroenterology;;  ? HEMOSTASIS CLIP PLACEMENT  09/24/2021  ? Procedure: HEMOSTASIS CLIP PLACEMENT;  Surgeon: Irving Copas., MD;  Location: [place];  Service: Gastroenterology;;  ? LITHOTRIPSY    ? x3  ? POLYPECTOMY  09/24/2021  ? Procedure: POLYPECTOMY;  Surgeon: Mansouraty, Telford Nab., MD;  Location: [place];  Service: Gastroenterology;;  ? [place] INJECTION  09/24/2021  ? Procedure: SUBMUCOSAL LIFTING INJECTION;  Surgeon: Irving Copas., MD;  Location: [place];  Service: Gastroenterology;;  ? ? ?Home Medications:  ?Allergies as of 09/29/2021   ?No Known Allergies ?  ? ?  ?Medication List  ?  ? ?  ? Accurate as of Sep 29, 2021  1:10 PM. If you have any questions, ask your nurse or doctor.  ?  ?  ? ?  ? ?ferrous gluconate 324 MG tablet ?Commonly known as: FERGON ?Take 1 tablet (324 mg total) by mouth daily with breakfast. ?  ?losartan 25 MG tablet ?Commonly known as: COZAAR ?Take 25 mg by mouth daily. ?  ?metFORMIN 500 MG tablet ?Commonly known as: GLUCOPHAGE ?Take 500 mg by mouth 2 (two) times daily with a meal. ?  ?NIFEdipine 30 MG 24 hr tablet ?Commonly known as: PROCARDIA-XL/NIFEDICAL-XL ?Take 30 mg by mouth daily. ?  ?ondansetron 4 MG tablet ?Commonly known as: ZOFRAN ?Take 1 tablet by mouth 30 mins before starting preparation for colonoscopy. ?  ? ?  ? ? ?Allergies: No Known Allergies ? ?Family History  ?Problem Relation Age of Onset  ? Cancer - Lung Brother   ?     2 brothers  ? Prostate cancer Brother   ? Colon cancer Neg Hx   ? Stomach cancer Neg Hx   ? Esophageal cancer Neg Hx   ? Inflammatory bowel disease Neg Hx   ? Liver  disease Neg Hx   ? Pancreatic cancer Neg Hx   ? Rectal cancer Neg Hx   ? ? ?Social History:  reports that he quit smoking about 62 years ago. His smoking use included cigarettes. He has a 6.00 pack-year smoking history. He has never used smokeless tobacco. He reports that he does not drink alcohol and does not use drugs. ? ?ROS: ?A complete review of systems was performed.  All systems are negative except for pertinent findings as noted. ? ?Physical Exam:  ?Vital signs in last 24 hours: ?There were no vitals taken for this visit. ?Constitutional:  Alert and oriented, No acute distress ?Cardiovascular: Regular rate  ?Respiratory: Normal respiratory effort ?Neurologic: Grossly intact, no focal deficits ?Psychiatric: Normal mood and affect ? ?I have reviewed prior pt notes ? ?I have reviewed notes from referring/previous physicians--patient had recent colonoscopy.  I reviewed biopsy results with him ? ?I have reviewed urinalysis results ? ?I have independently reviewed prior imaging--I reviewed most recent CT images ? ?  I have reviewed prior PSA results ? ? ? ? ?Impression/Assessment:  ?1.  Branched lower pole renal stone, patient asymptomatic, 88 years old, we are following this without intervention ? ?2.  Low risk, low-volume prostate cancer.  On active surveillance, appropriately for an 88 year old ? ?Plan:  ?1.  I will check PSA today ? ?2.  I will see back on an annual basis ?

## 2021-09-30 LAB — URINALYSIS, ROUTINE W REFLEX MICROSCOPIC
Bilirubin, UA: NEGATIVE
Glucose, UA: NEGATIVE
Ketones, UA: NEGATIVE
Leukocytes,UA: NEGATIVE
Nitrite, UA: NEGATIVE
Specific Gravity, UA: >1.03 — ABNORMAL HIGH (ref 1.005–1.030)
Urobilinogen, Ur: 0.2 mg/dL (ref 0.2–1.0)
pH, UA: 5.5 (ref 5.0–7.5)

## 2021-09-30 LAB — MICROSCOPIC EXAMINATION
Bacteria, UA: NONE SEEN
Renal Epithel, UA: NONE SEEN /hpf
WBC, UA: NONE SEEN /hpf (ref 0–5)

## 2021-09-30 LAB — PSA: Prostate Specific Ag, Serum: 10.7 ng/mL — ABNORMAL HIGH (ref 0.0–4.0)

## 2021-10-15 DIAGNOSIS — Z0000 Encounter for general adult medical examination without abnormal findings: Secondary | ICD-10-CM | POA: Diagnosis not present

## 2021-10-15 DIAGNOSIS — Z79899 Other long term (current) drug therapy: Secondary | ICD-10-CM | POA: Diagnosis not present

## 2021-10-15 DIAGNOSIS — I10 Essential (primary) hypertension: Secondary | ICD-10-CM | POA: Diagnosis not present

## 2021-10-15 DIAGNOSIS — Z7189 Other specified counseling: Secondary | ICD-10-CM | POA: Diagnosis not present

## 2021-10-15 DIAGNOSIS — R5383 Other fatigue: Secondary | ICD-10-CM | POA: Diagnosis not present

## 2021-10-15 DIAGNOSIS — Z299 Encounter for prophylactic measures, unspecified: Secondary | ICD-10-CM | POA: Diagnosis not present

## 2021-11-13 DIAGNOSIS — J449 Chronic obstructive pulmonary disease, unspecified: Secondary | ICD-10-CM | POA: Diagnosis not present

## 2021-11-13 DIAGNOSIS — Z720 Tobacco use: Secondary | ICD-10-CM | POA: Diagnosis not present

## 2021-11-13 DIAGNOSIS — E039 Hypothyroidism, unspecified: Secondary | ICD-10-CM | POA: Diagnosis not present

## 2021-11-13 DIAGNOSIS — E7849 Other hyperlipidemia: Secondary | ICD-10-CM | POA: Diagnosis not present

## 2021-11-16 DIAGNOSIS — I700 Atherosclerosis of aorta: Secondary | ICD-10-CM | POA: Diagnosis not present

## 2021-11-16 DIAGNOSIS — E1129 Type 2 diabetes mellitus with other diabetic kidney complication: Secondary | ICD-10-CM | POA: Diagnosis not present

## 2021-11-16 DIAGNOSIS — E1165 Type 2 diabetes mellitus with hyperglycemia: Secondary | ICD-10-CM | POA: Diagnosis not present

## 2021-11-16 DIAGNOSIS — Z299 Encounter for prophylactic measures, unspecified: Secondary | ICD-10-CM | POA: Diagnosis not present

## 2021-11-16 DIAGNOSIS — I10 Essential (primary) hypertension: Secondary | ICD-10-CM | POA: Diagnosis not present

## 2022-01-01 DIAGNOSIS — N200 Calculus of kidney: Secondary | ICD-10-CM | POA: Diagnosis not present

## 2022-01-01 DIAGNOSIS — R350 Frequency of micturition: Secondary | ICD-10-CM | POA: Diagnosis not present

## 2022-01-01 DIAGNOSIS — R319 Hematuria, unspecified: Secondary | ICD-10-CM | POA: Diagnosis not present

## 2022-01-01 DIAGNOSIS — I10 Essential (primary) hypertension: Secondary | ICD-10-CM | POA: Diagnosis not present

## 2022-01-01 DIAGNOSIS — Z299 Encounter for prophylactic measures, unspecified: Secondary | ICD-10-CM | POA: Diagnosis not present

## 2022-01-15 DIAGNOSIS — Z299 Encounter for prophylactic measures, unspecified: Secondary | ICD-10-CM | POA: Diagnosis not present

## 2022-01-15 DIAGNOSIS — I10 Essential (primary) hypertension: Secondary | ICD-10-CM | POA: Diagnosis not present

## 2022-01-15 DIAGNOSIS — I700 Atherosclerosis of aorta: Secondary | ICD-10-CM | POA: Diagnosis not present

## 2022-01-15 DIAGNOSIS — Z0000 Encounter for general adult medical examination without abnormal findings: Secondary | ICD-10-CM | POA: Diagnosis not present

## 2022-01-15 DIAGNOSIS — Z85038 Personal history of other malignant neoplasm of large intestine: Secondary | ICD-10-CM | POA: Diagnosis not present

## 2022-02-09 DIAGNOSIS — H43813 Vitreous degeneration, bilateral: Secondary | ICD-10-CM | POA: Diagnosis not present

## 2022-02-09 DIAGNOSIS — H524 Presbyopia: Secondary | ICD-10-CM | POA: Diagnosis not present

## 2022-02-12 DIAGNOSIS — N390 Urinary tract infection, site not specified: Secondary | ICD-10-CM | POA: Diagnosis not present

## 2022-02-12 DIAGNOSIS — C189 Malignant neoplasm of colon, unspecified: Secondary | ICD-10-CM | POA: Diagnosis not present

## 2022-02-12 DIAGNOSIS — Z299 Encounter for prophylactic measures, unspecified: Secondary | ICD-10-CM | POA: Diagnosis not present

## 2022-02-12 DIAGNOSIS — I10 Essential (primary) hypertension: Secondary | ICD-10-CM | POA: Diagnosis not present

## 2022-02-12 DIAGNOSIS — M545 Low back pain, unspecified: Secondary | ICD-10-CM | POA: Diagnosis not present

## 2022-02-16 ENCOUNTER — Encounter: Payer: Self-pay | Admitting: Urology

## 2022-02-16 ENCOUNTER — Ambulatory Visit (INDEPENDENT_AMBULATORY_CARE_PROVIDER_SITE_OTHER): Payer: Medicare Other | Admitting: Urology

## 2022-02-16 VITALS — BP 129/72 | HR 97

## 2022-02-16 DIAGNOSIS — N138 Other obstructive and reflux uropathy: Secondary | ICD-10-CM

## 2022-02-16 DIAGNOSIS — R3129 Other microscopic hematuria: Secondary | ICD-10-CM

## 2022-02-16 DIAGNOSIS — N401 Enlarged prostate with lower urinary tract symptoms: Secondary | ICD-10-CM | POA: Diagnosis not present

## 2022-02-16 DIAGNOSIS — N200 Calculus of kidney: Secondary | ICD-10-CM | POA: Diagnosis not present

## 2022-02-16 DIAGNOSIS — C61 Malignant neoplasm of prostate: Secondary | ICD-10-CM | POA: Diagnosis not present

## 2022-02-16 NOTE — Progress Notes (Signed)
History of Present Illness: 88 year old male comes in today for follow-up of recent urinary issues.  He has been followed here long-term.  5 days ago he began experiencing some back pain as well as initial hematuria.  He only passed 1 or 2 small clots.  That has not persisted.  Back pain is exacerbated and relieved with positional change.  No anterior abdominal pain, dysuria, change in normal urinary pattern.  PCa:   He underwent TRUS/Bx by Dr. Glendell Docker in [place], [place] in early November, 2012. At that time, he had a small left sided prostate nodule and a PSA of 5.4. 1/12 biopsies were positive for adenocarcinoma --the right apical lateral biopsy revealed GS 3+3 in a small focus. The other 11 biopsies were all negative. He decided on active surveillance, and had a repeat surveillance biopsy in May, 2014. This revealed one core, on the right mid lateral prostate, with a GS 3+3 in 5% of the core. All other biopsies were benign. Prostatic volume was 65 cc.    8.6.2019: PSA 6.8 in February 2019.    2.6.2020: MRI of prostate. Volume was 66 mL. TUR defect was present. PI-RADS category 3 lesion in right lateral mid/apical prostate.    3.23.2020: Underwent fusion biopsy. All 3 cores from ROI were benign. 2/12 routine cores revealed GS 3+3 pattern--both with 5% of cores involved.    9.8.2020:  PSA 6.7   3.16.2021:  PSA 7. Within the last few weeks he was diagnosed with colon cancer -- this is pending treatment. This was originally found after a single episode of blood per stool. He has been evaluated per CT scan (3.5.2021 -- records available).    9.21.2021: PSA 9.8. 1.4.2022: PSA 7.9 5.3.2022: PSA 8.3     9.6.2022: He does state that he has had increasing urinary frequency urgency and occasional urgency incontinence.  Slow stream.  IPSS 18.  He is not on any medication for BPH.  He has not had gross hematuria or dysuria.  He grew Klebsiella following that appointment and was treated appropriately  with sulfa. PSA was checked at this visit, without knowing he had an infection, and was 20.3  11.8.2022:PSA 10.7   5.16.2023: PSA 10.7      Urolithiasis:   He also has a history of urolithiasis.  Most recent imaging 1.11.2022---CT stone protocol--Stone in left lower pole appeared essentially the same size as several years before on prior CT. IMPRESSION: 1. Stable staghorn type calculi involving the lower pole region of the left kidney and 7 mm calculus in the midpole region of the right kidney. No obstructing ureteral calculi or bladder calculi. 2. Stable large upper pole left renal cyst. 3. No acute abdominal/pelvic findings, mass lesions or adenopathy. 4. Enlarged prostate gland with median lobe hypertrophy impressing on the base of the bladder. 5. Advanced atherosclerotic calcifications involving the aorta and iliac arteries. 6. Aortic atherosclerosis.    Past Medical History:  Diagnosis Date   Cancer [facility]    prostate cancer   Diabetes mellitus without complication ([place])    History of kidney stones    Hypertension     Past Surgical History:  Procedure Laterality Date   BIOPSY  09/24/2021   Procedure: BIOPSY;  Surgeon: Rush Landmark Telford Nab., MD;  [facility];  Service: Gastroenterology;;   CATARACT EXTRACTION W/PHACO Right 02/21/2017   Procedure: CATARACT EXTRACTION PHACO AND INTRAOCULAR LENS PLACEMENT ([place]);  Surgeon: Tonny Branch, MD;  Location: [facility];  Service: Ophthalmology;  Laterality: Right;  CDE: 10.14  CATARACT EXTRACTION W/PHACO Left 03/28/2017   Procedure: CATARACT EXTRACTION PHACO AND INTRAOCULAR LENS PLACEMENT (IOC);  Surgeon: Tonny Branch, MD;  Location: [facility];  Service: Ophthalmology;  Laterality: Left;  CDE: 8.57   COLON RESECTION  2021   COLONOSCOPY WITH PROPOFOL N/A 09/24/2021   Procedure: COLONOSCOPY WITH PROPOFOL;  Surgeon: Rush Landmark Telford Nab., MD;  Location: [place];  Service: Gastroenterology;  Laterality: N/A;   ENDOSCOPIC  MUCOSAL RESECTION N/A 09/24/2021   Procedure: ENDOSCOPIC MUCOSAL RESECTION;  Surgeon: Rush Landmark Telford Nab., MD;  Location: [place];  Service: Gastroenterology;  Laterality: N/A;   ESOPHAGOGASTRODUODENOSCOPY (EGD) WITH PROPOFOL N/A 09/24/2021   Procedure: ESOPHAGOGASTRODUODENOSCOPY (EGD) WITH PROPOFOL;  Surgeon: Rush Landmark Telford Nab., MD;  Location: [place];  Service: Gastroenterology;  Laterality: N/A;   FOREIGN BODY REMOVAL  09/24/2021   Procedure: FOREIGN BODY REMOVAL;  Surgeon: Rush Landmark Telford Nab., MD;  Location: [place];  Service: Gastroenterology;;   HEMOSTASIS CLIP PLACEMENT  09/24/2021   Procedure: HEMOSTASIS CLIP PLACEMENT;  Surgeon: Irving Copas., MD;  Location: [place];  Service: Gastroenterology;;   LITHOTRIPSY     x3   POLYPECTOMY  09/24/2021   Procedure: POLYPECTOMY;  Surgeon: Irving Copas., MD;  Location: [place];  Service: Gastroenterology;;   SUBMUCOSAL LIFTING INJECTION  09/24/2021   Procedure: SUBMUCOSAL LIFTING INJECTION;  Surgeon: Irving Copas., MD;  Location: [place];  Service: Gastroenterology;;    Home Medications:  Allergies as of 02/16/2022   No Known Allergies      Medication List        Accurate as of February 16, 2022  2:10 PM. If you have any questions, ask your nurse or doctor.          ferrous gluconate 324 MG tablet Commonly known as: FERGON Take 1 tablet (324 mg total) by mouth daily with breakfast.   losartan 25 MG tablet Commonly known as: COZAAR Take 25 mg by mouth daily.   metFORMIN 500 MG tablet Commonly known as: GLUCOPHAGE Take 500 mg by mouth 2 (two) times daily with a meal.   NIFEdipine 30 MG 24 hr tablet Commonly known as: PROCARDIA-XL/NIFEDICAL-XL Take 30 mg by mouth daily.   NIFEdipine 20 MG capsule Commonly known as: PROCARDIA 1 capsule as needed   ondansetron 4 MG tablet Commonly known as: ZOFRAN Take 1 tablet by mouth 30 mins before starting preparation for  colonoscopy.        Allergies: No Known Allergies  Family History  Problem Relation Age of Onset   Cancer - Lung Brother        2 brothers   Prostate cancer Brother    Colon cancer Neg Hx    Stomach cancer Neg Hx    Esophageal cancer Neg Hx    Inflammatory bowel disease Neg Hx    Liver disease Neg Hx    Pancreatic cancer Neg Hx    Rectal cancer Neg Hx     Social History:  reports that he quit smoking about 63 years ago. His smoking use included cigarettes. He has a 6.00 pack-year smoking history. He has never used smokeless tobacco. He reports that he does not drink alcohol and does not use drugs.  ROS: A complete review of systems was performed.  All systems are negative except for pertinent findings as noted.  Physical Exam:  Vital signs in last 24 hours: BP 129/72   Pulse 97  Constitutional:  Alert and oriented, No acute distress Cardiovascular: Regular rate  Respiratory: Normal respiratory effor Genitourinary: Phallus  uncircumcised.  Scrotal skin normal.  Left greater than right hydrocele.  Testicular exam normal. Lymphatic: No lymphadenopathy Neurologic: Grossly intact, no focal deficits Psychiatric: Normal mood and affect  I have reviewed prior pt notes  I have reviewed urinalysis results  I have independently reviewed prior imaging-I reviewed most recent CT images with him.  I have reviewed prior PSA/pathology results  I have reviewed prior urine culture   Impression/Assessment:  1.  Recent episode of initial hematuria.  Doubt UTI.  He does have a very large prostate  2.  Grade group 1 prostate cancer, on active surveillance.  3.  Bilateral renal calculi, asymptomatic  Plan:  1.  I reassured the patient about his blood-most likely from his prostate.  He was given alfuzosin by his PCP but he was not having urinary symptoms beforehand.  I have suggested he stop that  2.  I am fine with him continuing his course of Levaquin that was given  3.  Keep  scheduled appt

## 2022-02-17 LAB — URINALYSIS, ROUTINE W REFLEX MICROSCOPIC
Bilirubin, UA: NEGATIVE
Glucose, UA: NEGATIVE
Ketones, UA: NEGATIVE
Leukocytes,UA: NEGATIVE
Nitrite, UA: NEGATIVE
Specific Gravity, UA: 1.025 (ref 1.005–1.030)
Urobilinogen, Ur: 0.2 mg/dL (ref 0.2–1.0)
pH, UA: 5 (ref 5.0–7.5)

## 2022-02-17 LAB — MICROSCOPIC EXAMINATION: Bacteria, UA: NONE SEEN

## 2022-02-26 DIAGNOSIS — Z23 Encounter for immunization: Secondary | ICD-10-CM | POA: Diagnosis not present

## 2022-02-26 DIAGNOSIS — M545 Low back pain, unspecified: Secondary | ICD-10-CM | POA: Diagnosis not present

## 2022-02-26 DIAGNOSIS — Z299 Encounter for prophylactic measures, unspecified: Secondary | ICD-10-CM | POA: Diagnosis not present

## 2022-02-26 DIAGNOSIS — I10 Essential (primary) hypertension: Secondary | ICD-10-CM | POA: Diagnosis not present

## 2022-02-26 DIAGNOSIS — E1165 Type 2 diabetes mellitus with hyperglycemia: Secondary | ICD-10-CM | POA: Diagnosis not present

## 2022-03-23 DIAGNOSIS — M545 Low back pain, unspecified: Secondary | ICD-10-CM | POA: Diagnosis not present

## 2022-03-23 DIAGNOSIS — I10 Essential (primary) hypertension: Secondary | ICD-10-CM | POA: Diagnosis not present

## 2022-03-23 DIAGNOSIS — R350 Frequency of micturition: Secondary | ICD-10-CM | POA: Diagnosis not present

## 2022-03-23 DIAGNOSIS — R3129 Other microscopic hematuria: Secondary | ICD-10-CM | POA: Diagnosis not present

## 2022-03-23 DIAGNOSIS — M549 Dorsalgia, unspecified: Secondary | ICD-10-CM | POA: Diagnosis not present

## 2022-03-23 DIAGNOSIS — Z299 Encounter for prophylactic measures, unspecified: Secondary | ICD-10-CM | POA: Diagnosis not present

## 2022-03-29 DIAGNOSIS — M545 Low back pain, unspecified: Secondary | ICD-10-CM | POA: Diagnosis not present

## 2022-03-29 DIAGNOSIS — Z299 Encounter for prophylactic measures, unspecified: Secondary | ICD-10-CM | POA: Diagnosis not present

## 2022-03-29 DIAGNOSIS — E1165 Type 2 diabetes mellitus with hyperglycemia: Secondary | ICD-10-CM | POA: Diagnosis not present

## 2022-03-29 DIAGNOSIS — I10 Essential (primary) hypertension: Secondary | ICD-10-CM | POA: Diagnosis not present

## 2022-03-29 DIAGNOSIS — N200 Calculus of kidney: Secondary | ICD-10-CM | POA: Diagnosis not present

## 2022-04-29 DIAGNOSIS — Z713 Dietary counseling and surveillance: Secondary | ICD-10-CM | POA: Diagnosis not present

## 2022-04-29 DIAGNOSIS — R350 Frequency of micturition: Secondary | ICD-10-CM | POA: Diagnosis not present

## 2022-04-29 DIAGNOSIS — R319 Hematuria, unspecified: Secondary | ICD-10-CM | POA: Diagnosis not present

## 2022-04-29 DIAGNOSIS — Z299 Encounter for prophylactic measures, unspecified: Secondary | ICD-10-CM | POA: Diagnosis not present

## 2022-06-18 IMAGING — CT CT RENAL STONE PROTOCOL
2 of 4 series · 16 of 46 positions shown, 18 images · non-contrast
Comparison: 08/22/2019

CLINICAL DATA: Right flank pain for 1 week.

EXAM:
CT ABDOMEN AND PELVIS WITHOUT CONTRAST
TECHNIQUE: Multidetector CT imaging of the abdomen and pelvis was performed
following the standard protocol without IV contrast.

[Series 2: axial st · axial · 0.83mm/px · z∈[+698,+1078]mm · 13 of 88 slices shown, 15 images]
[im 6/88  soft-tissue]
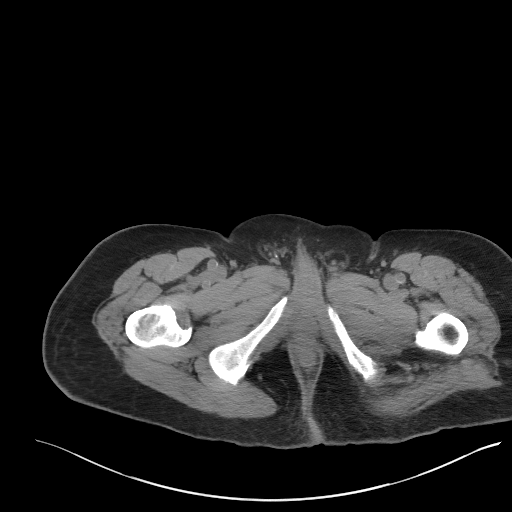
[im 6/88  bone]
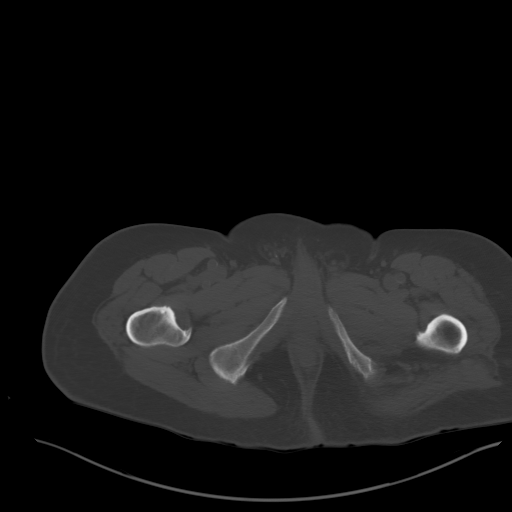
[im 11/88  soft-tissue]
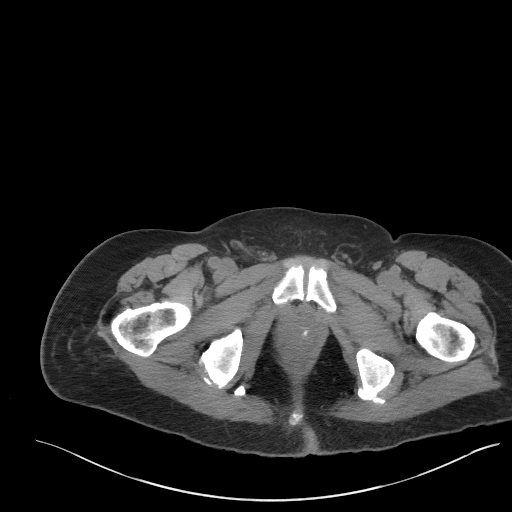
[im 21/88  soft-tissue]
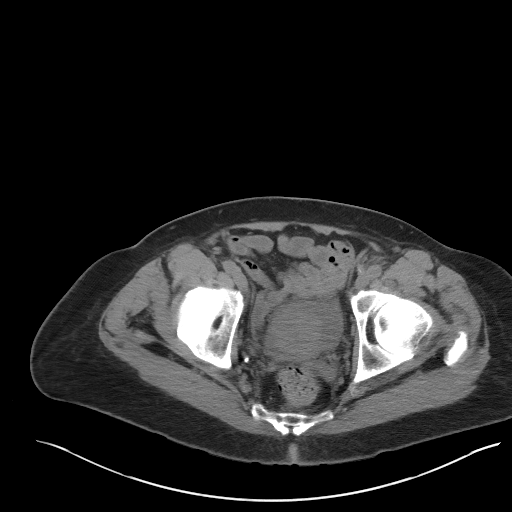
[im 26/88  soft-tissue]
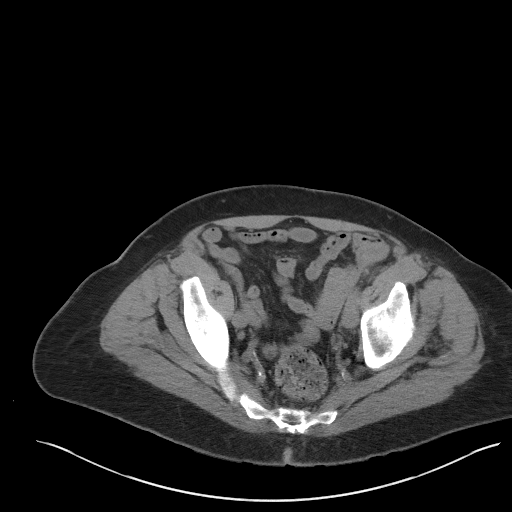
[im 31/88  soft-tissue]
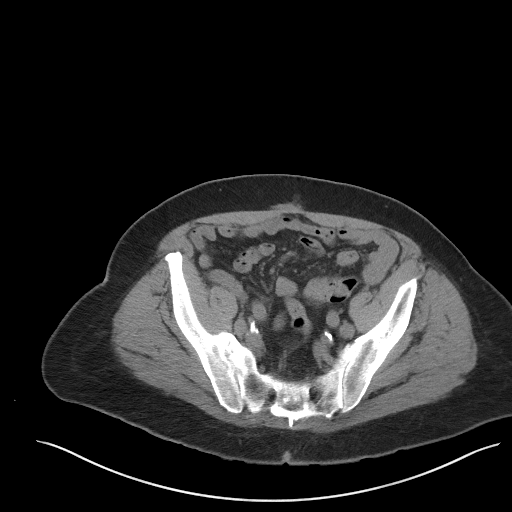
[im 36/88  soft-tissue]
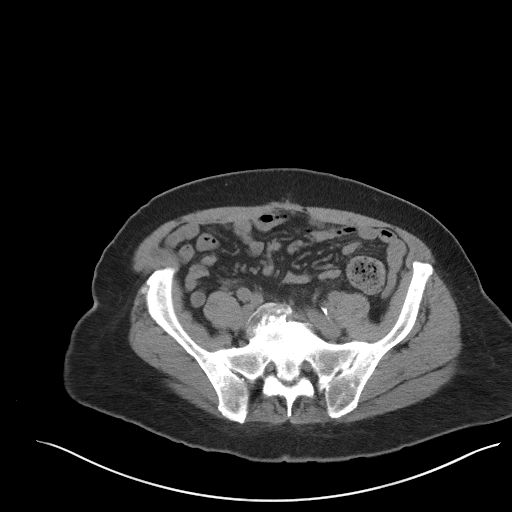
[im 47/88  soft-tissue]
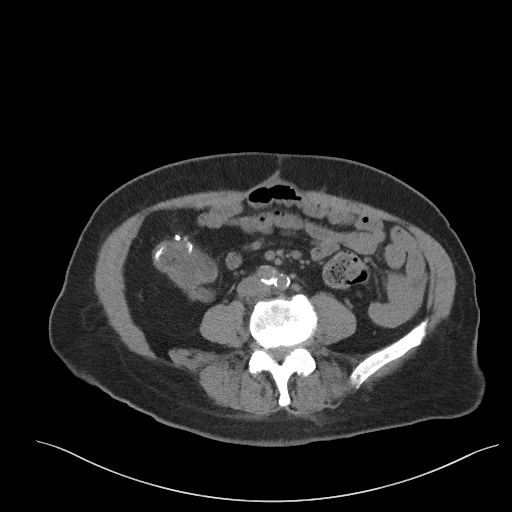
[im 52/88  soft-tissue]
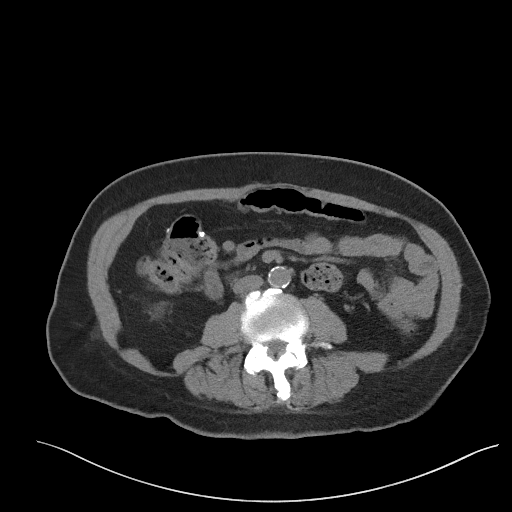
[im 57/88  soft-tissue]
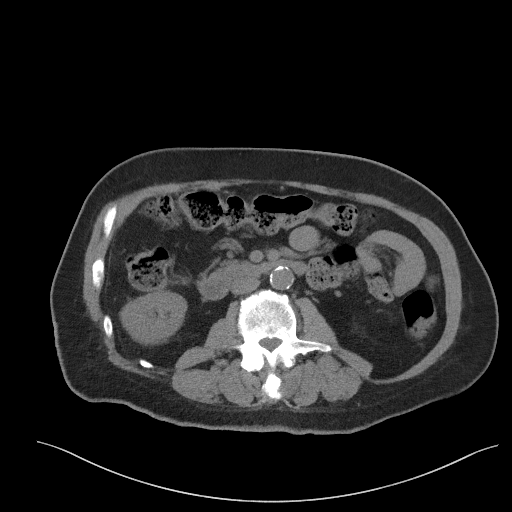
[im 57/88  bone]
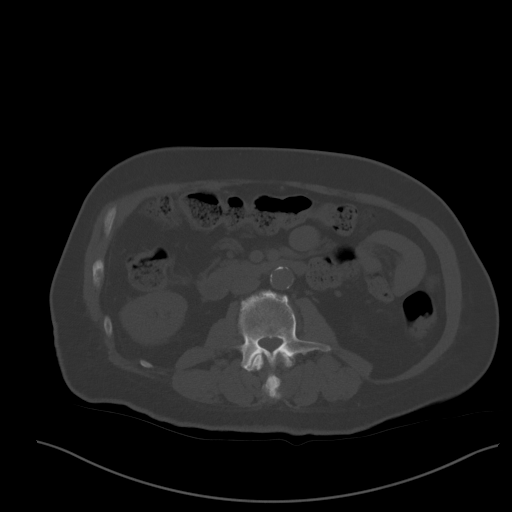
[im 62/88  soft-tissue]
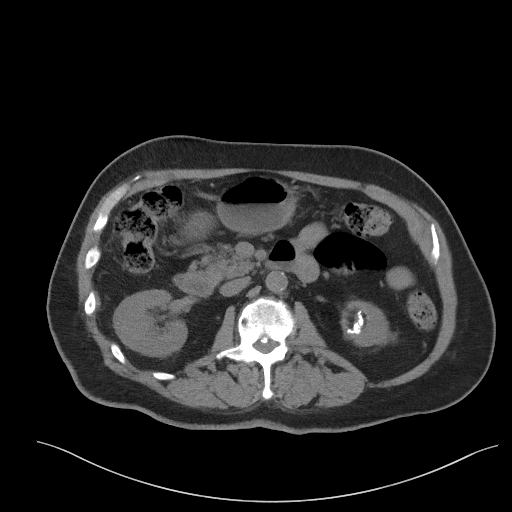
[im 67/88  soft-tissue]
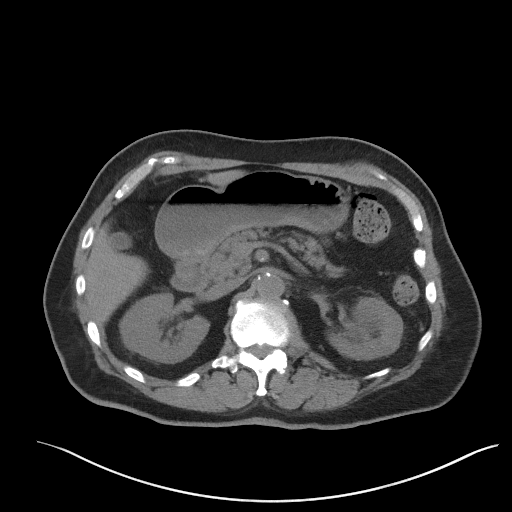
[im 77/88  soft-tissue]
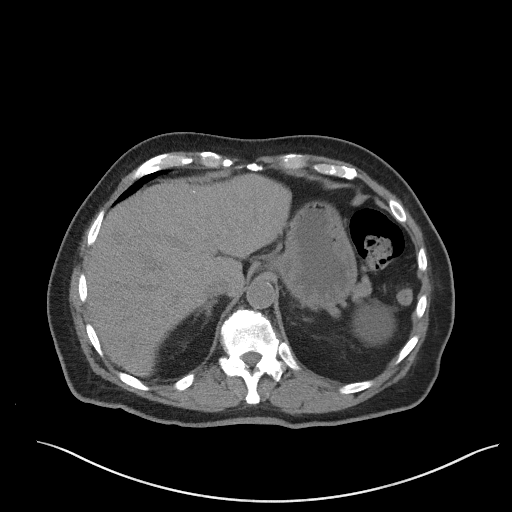
[im 82/88  soft-tissue]
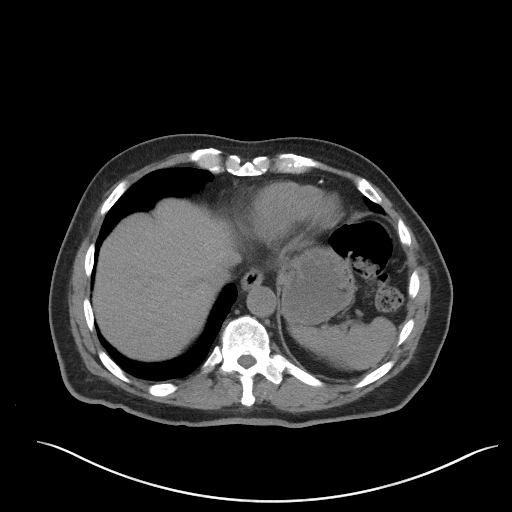

[Series 5: coronal st · coronal · 0.74mm/px · 3 of 101 slices shown]
[im 34/101  soft-tissue]
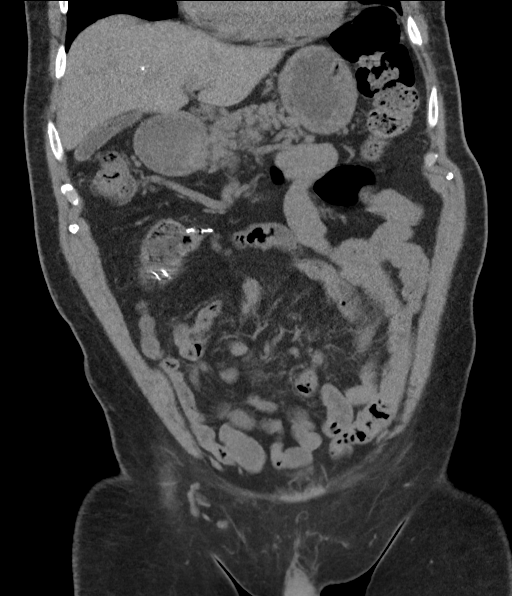
[im 45/101  soft-tissue]
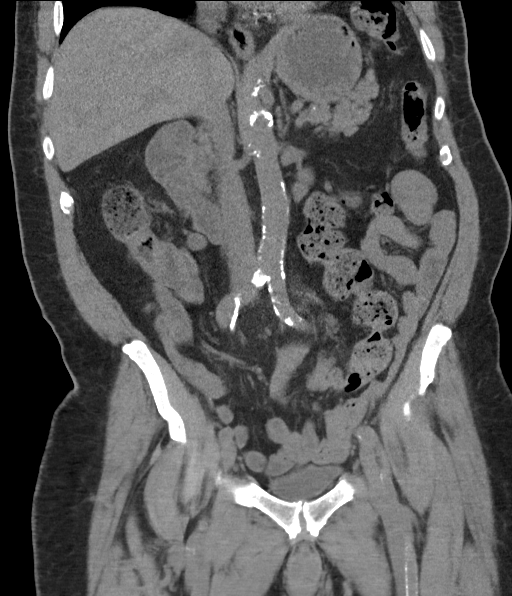
[im 56/101  soft-tissue]
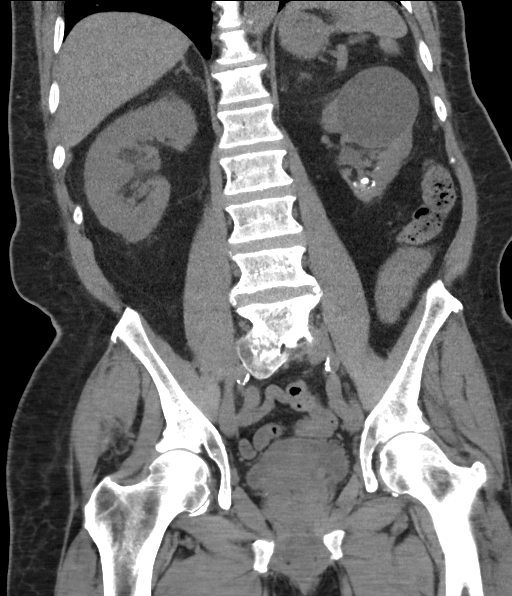

[16 of 46 positions shown; findings below may reference images not displayed]

FINDINGS: Lower chest: The lung bases are clear of an acute process. No
pleural effusions. Stable aortic and coronary artery calcifications.
No pericardial effusion.

Hepatobiliary: No hepatic lesions are identified without contrast.
No intrahepatic biliary dilatation. Stable scattered calcified
granulomas. The gallbladder is mildly contracted. No common bile
duct dilatation.

Pancreas: No mass, inflammation or ductal dilatation.

Spleen: Normal size.  Small calcified granulomas.

Adrenals/Urinary Tract: The adrenal glands are unremarkable and
stable.

Stable large upper pole left renal cyst.

Stable staghorn type calculi involving the lower pole region of the
left kidney. There is also a 7 mm calculus in the midpole region of
the right kidney. No obstructing ureteral calculi are identified. No
bladder calculi. Small hyperdense/hemorrhagic cyst associated with
the upper pole region of the right kidney. No obvious worrisome
renal or bladder lesions without contrast.

Stomach/Bowel: Stomach, duodenum, small bowel and colon are
unremarkable. No acute inflammatory changes, mass lesions or
obstructive findings. Stable surgical changes in the region of the
terminal ileum.

Vascular/Lymphatic: Advanced atherosclerotic calcifications
involving the aorta and iliac arteries.

Reproductive: Enlarged prostate gland with median lobe hypertrophy
impressing on the base of the bladder. The seminal vesicles are
unremarkable.

Other: No free fluid or free air. No abdominal wall hernia or
subcutaneous lesions.

Musculoskeletal: No significant bony findings. L5-S1 are near
completely fused. Age related facet disease.
IMPRESSION: 1. Stable staghorn type calculi involving the lower pole region of
the left kidney and 7 mm calculus in the midpole region of the right
kidney. No obstructing ureteral calculi or bladder calculi.
2. Stable large upper pole left renal cyst.
3. No acute abdominal/pelvic findings, mass lesions or adenopathy.
4. Enlarged prostate gland with median lobe hypertrophy impressing
on the base of the bladder.
5. Advanced atherosclerotic calcifications involving the aorta and
iliac arteries.
6. Aortic atherosclerosis.

Aortic Atherosclerosis ([id number]).

## 2022-08-18 ENCOUNTER — Ambulatory Visit: Payer: Medicare Other | Admitting: Family Medicine

## 2022-08-24 DIAGNOSIS — E1142 Type 2 diabetes mellitus with diabetic polyneuropathy: Secondary | ICD-10-CM | POA: Diagnosis not present

## 2022-08-24 DIAGNOSIS — E1165 Type 2 diabetes mellitus with hyperglycemia: Secondary | ICD-10-CM | POA: Diagnosis not present

## 2022-08-24 DIAGNOSIS — R350 Frequency of micturition: Secondary | ICD-10-CM | POA: Diagnosis not present

## 2022-08-24 DIAGNOSIS — Z299 Encounter for prophylactic measures, unspecified: Secondary | ICD-10-CM | POA: Diagnosis not present

## 2022-08-24 DIAGNOSIS — R319 Hematuria, unspecified: Secondary | ICD-10-CM | POA: Diagnosis not present

## 2022-08-24 DIAGNOSIS — I10 Essential (primary) hypertension: Secondary | ICD-10-CM | POA: Diagnosis not present

## 2022-09-13 NOTE — Progress Notes (Signed)
History of Present Illness:   89 year old male comes in today for follow-up of recent urinary issues.  He has been followed here long-term.   PCa:   He underwent TRUS/Bx by Dr. Baldo Ash in [place], [place] in early November, 2012. At that time, he had a small left sided prostate nodule and a PSA of 5.4. 1/12 biopsies were positive for adenocarcinoma --the right apical lateral biopsy revealed GS 3+3 in a small focus. The other 11 biopsies were all negative. He decided on active surveillance, and had a repeat surveillance biopsy in May, 2014. This revealed one core, on the right mid lateral prostate, with a GS 3+3 in 5% of the core. All other biopsies were benign. Prostatic volume was 65 cc.    8.6.2019: PSA 6.8 in February 2019.    2.6.2020: MRI of prostate. Volume was 66 mL. TUR defect was present. PI-RADS category 3 lesion in right lateral mid/apical prostate.    3.23.2020: Underwent fusion biopsy. All 3 cores from ROI were benign. 2/12 routine cores revealed GS 3+3 pattern--both with 5% of cores involved.    9.8.2020:  PSA 6.7   3.16.2021:  PSA 7. Within the last few weeks he was diagnosed with colon cancer -- this is pending treatment. This was originally found after a single episode of blood per stool. He has been evaluated per CT scan (3.5.2021 -- records available).    9.21.2021: PSA 9.8. 1.4.2022: PSA 7.9 5.3.2022: PSA 8.3 11.8.2022:PSA 10.7 5.16.2023: PSA 10.7       Urolithiasis:   He also has a history of urolithiasis.  Most recent imaging 1.11.2022---CT stone protocol--Stone in left lower pole appeared essentially the same size as several years before on prior CT. IMPRESSION: 1. Stable staghorn type calculi involving the lower pole region of the left kidney and 7 mm calculus in the midpole region of the right kidney. No obstructing ureteral calculi or bladder calculi. 2. Stable large upper pole left renal cyst. 3. No acute abdominal/pelvic findings, mass lesions or  adenopathy. 4. Enlarged prostate gland with median lobe hypertrophy impressing on the base of the bladder. 5. Advanced atherosclerotic calcifications involving the aorta and iliac arteries. 6. Aortic atherosclerosis.  Past Medical History:  Diagnosis Date   Cancer [facility])    prostate cancer   Diabetes mellitus without complication (HCC)    History of kidney stones    Hypertension     Past Surgical History:  Procedure Laterality Date   BIOPSY  09/24/2021   Procedure: BIOPSY;  Surgeon: Meridee Score Netty Starring., MD;  Location: [facility] ENDOSCOPY;  Service: Gastroenterology;;   CATARACT EXTRACTION W/PHACO Right 02/21/2017   Procedure: CATARACT EXTRACTION PHACO AND INTRAOCULAR LENS PLACEMENT (IOC);  Surgeon: Gemma Payor, MD;  Location: [facility] ORS;  Service: Ophthalmology;  Laterality: Right;  CDE: 10.14   CATARACT EXTRACTION W/PHACO Left 03/28/2017   Procedure: CATARACT EXTRACTION PHACO AND INTRAOCULAR LENS PLACEMENT (IOC);  Surgeon: Gemma Payor, MD;  Location: [facility] ORS;  Service: Ophthalmology;  Laterality: Left;  CDE: 8.57   COLON RESECTION  2021   COLONOSCOPY WITH PROPOFOL N/A 09/24/2021   Procedure: COLONOSCOPY WITH PROPOFOL;  Surgeon: Meridee Score Netty Starring., MD;  Location: [facility] ENDOSCOPY;  Service: Gastroenterology;  Laterality: N/A;   ENDOSCOPIC MUCOSAL RESECTION N/A 09/24/2021   Procedure: ENDOSCOPIC MUCOSAL RESECTION;  Surgeon: Meridee Score Netty Starring., MD;  Location: [facility] ENDOSCOPY;  Service: Gastroenterology;  Laterality: N/A;   ESOPHAGOGASTRODUODENOSCOPY (EGD) WITH PROPOFOL N/A 09/24/2021   Procedure: ESOPHAGOGASTRODUODENOSCOPY (EGD) WITH PROPOFOL;  Surgeon: Meridee Score Netty Starring., MD;  Location:  [facility] ENDOSCOPY;  Service: Gastroenterology;  Laterality: N/A;   FOREIGN BODY REMOVAL  09/24/2021   Procedure: FOREIGN BODY REMOVAL;  Surgeon: Meridee Score Netty Starring., MD;  Location: [facility] ENDOSCOPY;  Service: Gastroenterology;;   HEMOSTASIS CLIP PLACEMENT  09/24/2021   Procedure: HEMOSTASIS CLIP PLACEMENT;   Surgeon: Lemar Lofty., MD;  Location: [facility] ENDOSCOPY;  Service: Gastroenterology;;   LITHOTRIPSY     x3   POLYPECTOMY  09/24/2021   Procedure: POLYPECTOMY;  Surgeon: Lemar Lofty., MD;  Location: [facility] ENDOSCOPY;  Service: Gastroenterology;;   SUBMUCOSAL LIFTING INJECTION  09/24/2021   Procedure: SUBMUCOSAL LIFTING INJECTION;  Surgeon: Lemar Lofty., MD;  Location: [facility] ENDOSCOPY;  Service: Gastroenterology;;    Home Medications:  Allergies as of 09/14/2022   No Known Allergies      Medication List        Accurate as of September 13, 2022  7:36 PM. If you have any questions, ask your nurse or doctor.          ferrous gluconate 324 MG tablet Commonly known as: FERGON Take 1 tablet (324 mg total) by mouth daily with breakfast.   losartan 25 MG tablet Commonly known as: COZAAR Take 25 mg by mouth daily.   metFORMIN 500 MG tablet Commonly known as: GLUCOPHAGE Take 500 mg by mouth 2 (two) times daily with a meal.   NIFEdipine 30 MG 24 hr tablet Commonly known as: PROCARDIA-XL/NIFEDICAL-XL Take 30 mg by mouth daily.   NIFEdipine 20 MG capsule Commonly known as: PROCARDIA 1 capsule as needed   ondansetron 4 MG tablet Commonly known as: ZOFRAN Take 1 tablet by mouth 30 mins before starting preparation for colonoscopy.        Allergies: No Known Allergies  Family History  Problem Relation Age of Onset   Cancer - Lung Brother        2 brothers   Prostate cancer Brother    Colon cancer Neg Hx    Stomach cancer Neg Hx    Esophageal cancer Neg Hx    Inflammatory bowel disease Neg Hx    Liver disease Neg Hx    Pancreatic cancer Neg Hx    Rectal cancer Neg Hx     Social History:  reports that he quit smoking about 63 years ago. His smoking use included cigarettes. He has a 6.00 pack-year smoking history. He has never used smokeless tobacco. He reports that he does not drink alcohol and does not use drugs.  ROS: A complete review of systems was  performed.  All systems are negative except for pertinent findings as noted.  Physical Exam:  Vital signs in last 24 hours: There were no vitals taken for this visit. Constitutional:  Alert and oriented, No acute distress Cardiovascular: Regular rate  Respiratory: Normal respiratory effort GI: Abdomen is soft, nontender, nondistended, no abdominal masses. No CVAT.  Genitourinary: Normal male phallus, testes are descended bilaterally and non-tender and without masses, scrotum is normal in appearance without lesions or masses, perineum is normal on inspection. Lymphatic: No lymphadenopathy Neurologic: Grossly intact, no focal deficits Psychiatric: Normal mood and affect  I have reviewed prior pt notes  I have reviewed urinalysis results  I have independently reviewed prior imaging  I have reviewed prior PSA results  I have reviewed prior urine culture   Impression/Assessment:  ***  Plan:  ***

## 2022-09-14 ENCOUNTER — Encounter: Payer: Self-pay | Admitting: Urology

## 2022-09-14 ENCOUNTER — Ambulatory Visit: Payer: Medicare Other | Admitting: Urology

## 2022-09-14 VITALS — BP 131/74 | HR 73

## 2022-09-14 DIAGNOSIS — R3129 Other microscopic hematuria: Secondary | ICD-10-CM

## 2022-09-14 DIAGNOSIS — N401 Enlarged prostate with lower urinary tract symptoms: Secondary | ICD-10-CM

## 2022-09-14 DIAGNOSIS — N138 Other obstructive and reflux uropathy: Secondary | ICD-10-CM

## 2022-09-14 DIAGNOSIS — C61 Malignant neoplasm of prostate: Secondary | ICD-10-CM

## 2022-09-14 LAB — BLADDER SCAN AMB NON-IMAGING: Scan Result: 20

## 2022-09-14 MED ORDER — FINASTERIDE 5 MG PO TABS: 5.0000 mg | ORAL_TABLET | Freq: Every day | ORAL | 11 refills | Status: DC

## 2022-09-14 NOTE — Progress Notes (Signed)
Pt here today for bladder scan. Bladder was scanned and 20 was visualized.    Performed by Kennyth Lose, CMA

## 2022-09-15 LAB — MICROSCOPIC EXAMINATION
Bacteria, UA: NONE SEEN
RBC, Urine: >30 /hpf — AB (ref 0–2)

## 2022-09-15 LAB — PSA: Prostate Specific Ag, Serum: 12.5 ng/mL — ABNORMAL HIGH (ref 0.0–4.0)

## 2022-09-15 LAB — URINALYSIS, ROUTINE W REFLEX MICROSCOPIC
Bilirubin, UA: NEGATIVE
Glucose, UA: NEGATIVE
Ketones, UA: NEGATIVE
Leukocytes,UA: NEGATIVE
Nitrite, UA: NEGATIVE
Specific Gravity, UA: 1.025 (ref 1.005–1.030)
Urobilinogen, Ur: 0.2 mg/dL (ref 0.2–1.0)
pH, UA: 5.5 (ref 5.0–7.5)

## 2022-09-22 ENCOUNTER — Telehealth: Payer: Self-pay

## 2022-09-22 NOTE — Telephone Encounter (Signed)
Wife is aware of Dr. Retta Diones recommendation and wife voiced understanding

## 2022-09-22 NOTE — Telephone Encounter (Signed)
-----   Message from Marcine Matar, MD sent at 09/21/2022 12:14 PM EDT ----- Let patient know that PSA is 12.5, fairly stable for him. ----- Message ----- From: Troy Sine, CMA Sent: 09/15/2022  11:59 AM EDT To: Marcine Matar, MD  Please review

## 2022-09-23 ENCOUNTER — Encounter: Payer: Self-pay | Admitting: Urology

## 2022-10-05 ENCOUNTER — Ambulatory Visit: Payer: Medicare Other | Admitting: Urology

## 2022-10-18 DIAGNOSIS — Z7189 Other specified counseling: Secondary | ICD-10-CM | POA: Diagnosis not present

## 2022-10-18 DIAGNOSIS — Z299 Encounter for prophylactic measures, unspecified: Secondary | ICD-10-CM | POA: Diagnosis not present

## 2022-10-18 DIAGNOSIS — Z0000 Encounter for general adult medical examination without abnormal findings: Secondary | ICD-10-CM | POA: Diagnosis not present

## 2022-10-18 DIAGNOSIS — Z79899 Other long term (current) drug therapy: Secondary | ICD-10-CM | POA: Diagnosis not present

## 2022-10-18 DIAGNOSIS — R5383 Other fatigue: Secondary | ICD-10-CM | POA: Diagnosis not present

## 2022-10-18 DIAGNOSIS — I700 Atherosclerosis of aorta: Secondary | ICD-10-CM | POA: Diagnosis not present

## 2022-10-18 DIAGNOSIS — E7800 Pure hypercholesterolemia, unspecified: Secondary | ICD-10-CM | POA: Diagnosis not present

## 2022-10-18 DIAGNOSIS — I10 Essential (primary) hypertension: Secondary | ICD-10-CM | POA: Diagnosis not present

## 2022-11-23 DIAGNOSIS — Z0000 Encounter for general adult medical examination without abnormal findings: Secondary | ICD-10-CM | POA: Diagnosis not present

## 2022-11-23 DIAGNOSIS — Z299 Encounter for prophylactic measures, unspecified: Secondary | ICD-10-CM | POA: Diagnosis not present

## 2022-11-23 DIAGNOSIS — I700 Atherosclerosis of aorta: Secondary | ICD-10-CM | POA: Diagnosis not present

## 2022-11-23 DIAGNOSIS — I10 Essential (primary) hypertension: Secondary | ICD-10-CM | POA: Diagnosis not present

## 2022-11-23 DIAGNOSIS — E1165 Type 2 diabetes mellitus with hyperglycemia: Secondary | ICD-10-CM | POA: Diagnosis not present

## 2022-11-23 DIAGNOSIS — E1142 Type 2 diabetes mellitus with diabetic polyneuropathy: Secondary | ICD-10-CM | POA: Diagnosis not present

## 2023-02-02 DIAGNOSIS — E119 Type 2 diabetes mellitus without complications: Secondary | ICD-10-CM | POA: Diagnosis not present

## 2023-02-28 DIAGNOSIS — Z23 Encounter for immunization: Secondary | ICD-10-CM | POA: Diagnosis not present

## 2023-02-28 DIAGNOSIS — R413 Other amnesia: Secondary | ICD-10-CM | POA: Diagnosis not present

## 2023-02-28 DIAGNOSIS — R809 Proteinuria, unspecified: Secondary | ICD-10-CM | POA: Diagnosis not present

## 2023-02-28 DIAGNOSIS — Z299 Encounter for prophylactic measures, unspecified: Secondary | ICD-10-CM | POA: Diagnosis not present

## 2023-02-28 DIAGNOSIS — E1129 Type 2 diabetes mellitus with other diabetic kidney complication: Secondary | ICD-10-CM | POA: Diagnosis not present

## 2023-02-28 DIAGNOSIS — I10 Essential (primary) hypertension: Secondary | ICD-10-CM | POA: Diagnosis not present

## 2023-03-09 DIAGNOSIS — R413 Other amnesia: Secondary | ICD-10-CM | POA: Diagnosis not present

## 2023-03-15 ENCOUNTER — Encounter: Payer: Self-pay | Admitting: Urology

## 2023-03-15 ENCOUNTER — Ambulatory Visit: Payer: Medicare Other | Admitting: Urology

## 2023-03-15 VITALS — BP 157/79 | HR 57

## 2023-03-15 DIAGNOSIS — C61 Malignant neoplasm of prostate: Secondary | ICD-10-CM | POA: Diagnosis not present

## 2023-03-15 DIAGNOSIS — N200 Calculus of kidney: Secondary | ICD-10-CM

## 2023-03-15 DIAGNOSIS — N401 Enlarged prostate with lower urinary tract symptoms: Secondary | ICD-10-CM

## 2023-03-15 DIAGNOSIS — N138 Other obstructive and reflux uropathy: Secondary | ICD-10-CM

## 2023-03-15 NOTE — Progress Notes (Signed)
History of Present Illness:   89 year old male comes in today for follow-up of recent urinary issues.  He has been followed here long-term.   PCa:   He underwent TRUS/Bx by Dr. Baldo Ash in [place], [place] in early November, 2012. At that time, he had a small left sided prostate nodule and a PSA of 5.4. 1/12 biopsies were positive for adenocarcinoma --the right apical lateral biopsy revealed GS 3+3 in a small focus. The other 11 biopsies were all negative. He decided on active surveillance, and had a repeat surveillance biopsy in May, 2014. This revealed one core, on the right mid lateral prostate, with a GS 3+3 in 5% of the core. All other biopsies were benign. Prostatic volume was 65 cc.    8.6.2019: PSA 6.8 in February 2019.    2.6.2020: MRI of prostate. Volume was 66 mL. TUR defect was present. PI-RADS category 3 lesion in right lateral mid/apical prostate.    3.23.2020: Underwent fusion biopsy. All 3 cores from ROI were benign. 2/12 routine cores revealed GS 3+3 pattern--both with 5% of cores involved.    9.8.2020:  PSA 6.7   3.16.2021:  PSA 7. Within the last few weeks he was diagnosed with colon cancer -- this is pending treatment. This was originally found after a single episode of blood per stool. He has been evaluated per CT scan (3.5.2021 -- records available).    9.21.2021: PSA 9.8. 1.4.2022: PSA 7.9 5.3.2022: PSA 8.3 11.8.2022:PSA 10.7 5.16.2023: PSA 10.7 4.30.2024: PSA 12.5       Urolithiasis:   He also has a history of urolithiasis.  Most recent imaging 1.11.2022---CT stone protocol--Stone in left lower pole appeared essentially the same size as several years before on prior CT. IMPRESSION: 1. Stable staghorn type calculi involving the lower pole region of the left kidney and 7 mm calculus in the midpole region of the right kidney. No obstructing ureteral calculi or bladder calculi. 2. Stable large upper pole left renal cyst. 3. No acute abdominal/pelvic findings,  mass lesions or adenopathy. 4. Enlarged prostate gland with median lobe hypertrophy impressing on the base of the bladder. 5. Advanced atherosclerotic calcifications involving the aorta and iliac arteries. 6. Aortic atherosclerosis.   10.29.2024: He comes in today for routine check.  He denies any gross hematuria.  He has been on finasteride.  Most recent hemoglobin A1c was 6.1.  Biggest urinary issue is nocturia x 2-3 which he chalked up to his diabetes.  He has had no left flank pain.  No recent PSA.  Past Medical History:  Diagnosis Date   Cancer [facility])    prostate cancer   Diabetes mellitus without complication (HCC)    History of kidney stones    Hypertension     Past Surgical History:  Procedure Laterality Date   BIOPSY  09/24/2021   Procedure: BIOPSY;  Surgeon: Meridee Score Netty Starring., MD;  Location: [facility] ENDOSCOPY;  Service: Gastroenterology;;   CATARACT EXTRACTION W/PHACO Right 02/21/2017   Procedure: CATARACT EXTRACTION PHACO AND INTRAOCULAR LENS PLACEMENT (IOC);  Surgeon: Gemma Payor, MD;  Location: [facility]S;  Service: Ophthalmology;  Laterality: Right;  CDE: 10.14   CATARACT EXTRACTION W/PHACO Left 03/28/2017   Procedure: CATARACT EXTRACTION PHACO AND INTRAOCULAR LENS PLACEMENT (IOC);  Surgeon: Gemma Payor, MD;  Location: [facility] ORS;  Service: Ophthalmology;  Laterality: Left;  CDE: 8.57   COLON RESECTION  2021   COLONOSCOPY WITH PROPOFOL N/A 09/24/2021   Procedure: COLONOSCOPY WITH PROPOFOL;  Surgeon: Meridee Score Netty Starring., MD;  Location: [facility]  ENDOSCOPY;  Service: Gastroenterology;  Laterality: N/A;   ENDOSCOPIC MUCOSAL RESECTION N/A 09/24/2021   Procedure: ENDOSCOPIC MUCOSAL RESECTION;  Surgeon: Meridee Score Netty Starring., MD;  Location: [facility] ENDOSCOPY;  Service: Gastroenterology;  Laterality: N/A;   ESOPHAGOGASTRODUODENOSCOPY (EGD) WITH PROPOFOL N/A 09/24/2021   Procedure: ESOPHAGOGASTRODUODENOSCOPY (EGD) WITH PROPOFOL;  Surgeon: Meridee Score Netty Starring., MD;  Location: [facility] ENDOSCOPY;   Service: Gastroenterology;  Laterality: N/A;   FOREIGN BODY REMOVAL  09/24/2021   Procedure: FOREIGN BODY REMOVAL;  Surgeon: Meridee Score Netty Starring., MD;  Location: [facility] ENDOSCOPY;  Service: Gastroenterology;;   HEMOSTASIS CLIP PLACEMENT  09/24/2021   Procedure: HEMOSTASIS CLIP PLACEMENT;  Surgeon: Lemar Lofty., MD;  Location: [facility] ENDOSCOPY;  Service: Gastroenterology;;   LITHOTRIPSY     x3   POLYPECTOMY  09/24/2021   Procedure: POLYPECTOMY;  Surgeon: Lemar Lofty., MD;  Location: [facility] ENDOSCOPY;  Service: Gastroenterology;;   SUBMUCOSAL LIFTING INJECTION  09/24/2021   Procedure: SUBMUCOSAL LIFTING INJECTION;  Surgeon: Lemar Lofty., MD;  Location: [facility] ENDOSCOPY;  Service: Gastroenterology;;    Home Medications:  Allergies as of 03/15/2023   No Known Allergies      Medication List        Accurate as of March 15, 2023  7:59 AM. If you have any questions, ask your nurse or doctor.          ferrous gluconate 324 MG tablet Commonly known as: FERGON Take 1 tablet (324 mg total) by mouth daily with breakfast.   finasteride 5 MG tablet Commonly known as: PROSCAR Take 1 tablet (5 mg total) by mouth daily.   losartan 25 MG tablet Commonly known as: COZAAR Take 25 mg by mouth daily.   metFORMIN 500 MG tablet Commonly known as: GLUCOPHAGE Take 500 mg by mouth 2 (two) times daily with a meal.   NIFEdipine 30 MG 24 hr tablet Commonly known as: PROCARDIA-XL/NIFEDICAL-XL Take 30 mg by mouth daily.   NIFEdipine 20 MG capsule Commonly known as: PROCARDIA 1 capsule as needed   ondansetron 4 MG tablet Commonly known as: ZOFRAN Take 1 tablet by mouth 30 mins before starting preparation for colonoscopy.        Allergies: No Known Allergies  Family History  Problem Relation Age of Onset   Cancer - Lung Brother        2 brothers   Prostate cancer Brother    Colon cancer Neg Hx    Stomach cancer Neg Hx    Esophageal cancer Neg Hx    Inflammatory  bowel disease Neg Hx    Liver disease Neg Hx    Pancreatic cancer Neg Hx    Rectal cancer Neg Hx     Social History:  reports that he quit smoking about 64 years ago. His smoking use included cigarettes. He started smoking about 76 years ago. He has a 6 pack-year smoking history. He has never used smokeless tobacco. He reports that he does not drink alcohol and does not use drugs.  ROS: A complete review of systems was performed.  All systems are negative except for pertinent findings as noted.  Physical Exam:  Vital signs in last 24 hours: There were no vitals taken for this visit. Constitutional:  Alert and oriented, No acute distress Cardiovascular: Regular rate  Respiratory: Normal respiratory effort Neurologic: Grossly intact, no focal deficits Psychiatric: Normal mood and affect  I have reviewed prior pt notes  I have reviewed urinalysis results-still with microscopic hematuria  I have independently reviewed prior imaging-CT images as well  as MRI results reviewed  I have reviewed prior PSA results     Impression/Assessment:  Persistent hematuria with negative evaluation, most likely of prostate source.  I this has decreased since he has been on finasteride  GG 1 prostate cancer, on active surveillance.  PSA bumped 6 months ago  Large left lower pole stone, no need for treatment    Plan:  Continue finasteride  PSA is checked today  I will see back in 1 year for recheck

## 2023-03-16 LAB — URINALYSIS, ROUTINE W REFLEX MICROSCOPIC
Bilirubin, UA: NEGATIVE
Glucose, UA: NEGATIVE
Ketones, UA: NEGATIVE
Leukocytes,UA: NEGATIVE
Nitrite, UA: NEGATIVE
Specific Gravity, UA: 1.03 (ref 1.005–1.030)
Urobilinogen, Ur: 0.2 mg/dL (ref 0.2–1.0)
pH, UA: 6 (ref 5.0–7.5)

## 2023-03-16 LAB — MICROSCOPIC EXAMINATION: Bacteria, UA: NONE SEEN

## 2023-03-16 LAB — PSA: Prostate Specific Ag, Serum: 12.5 ng/mL — ABNORMAL HIGH (ref 0.0–4.0)

## 2023-03-23 DIAGNOSIS — Z299 Encounter for prophylactic measures, unspecified: Secondary | ICD-10-CM | POA: Diagnosis not present

## 2023-03-23 DIAGNOSIS — R413 Other amnesia: Secondary | ICD-10-CM | POA: Diagnosis not present

## 2023-03-23 DIAGNOSIS — I700 Atherosclerosis of aorta: Secondary | ICD-10-CM | POA: Diagnosis not present

## 2023-03-23 DIAGNOSIS — I10 Essential (primary) hypertension: Secondary | ICD-10-CM | POA: Diagnosis not present

## 2023-03-23 DIAGNOSIS — E1169 Type 2 diabetes mellitus with other specified complication: Secondary | ICD-10-CM | POA: Diagnosis not present

## 2023-05-04 DIAGNOSIS — E119 Type 2 diabetes mellitus without complications: Secondary | ICD-10-CM | POA: Diagnosis not present

## 2023-06-10 DIAGNOSIS — Z299 Encounter for prophylactic measures, unspecified: Secondary | ICD-10-CM | POA: Diagnosis not present

## 2023-06-10 DIAGNOSIS — I10 Essential (primary) hypertension: Secondary | ICD-10-CM | POA: Diagnosis not present

## 2023-06-10 DIAGNOSIS — E1169 Type 2 diabetes mellitus with other specified complication: Secondary | ICD-10-CM | POA: Diagnosis not present

## 2023-06-10 DIAGNOSIS — K219 Gastro-esophageal reflux disease without esophagitis: Secondary | ICD-10-CM | POA: Diagnosis not present

## 2023-06-10 DIAGNOSIS — I700 Atherosclerosis of aorta: Secondary | ICD-10-CM | POA: Diagnosis not present

## 2023-06-21 DIAGNOSIS — I10 Essential (primary) hypertension: Secondary | ICD-10-CM | POA: Diagnosis not present

## 2023-06-21 DIAGNOSIS — K5900 Constipation, unspecified: Secondary | ICD-10-CM | POA: Diagnosis not present

## 2023-06-21 DIAGNOSIS — Z299 Encounter for prophylactic measures, unspecified: Secondary | ICD-10-CM | POA: Diagnosis not present

## 2023-06-21 DIAGNOSIS — R319 Hematuria, unspecified: Secondary | ICD-10-CM | POA: Diagnosis not present

## 2023-07-15 DIAGNOSIS — K2270 Barrett's esophagus without dysplasia: Secondary | ICD-10-CM | POA: Diagnosis not present

## 2023-07-15 DIAGNOSIS — E1142 Type 2 diabetes mellitus with diabetic polyneuropathy: Secondary | ICD-10-CM | POA: Diagnosis not present

## 2023-07-15 DIAGNOSIS — Z299 Encounter for prophylactic measures, unspecified: Secondary | ICD-10-CM | POA: Diagnosis not present

## 2023-08-02 DIAGNOSIS — E119 Type 2 diabetes mellitus without complications: Secondary | ICD-10-CM | POA: Diagnosis not present

## 2023-08-15 DIAGNOSIS — H35033 Hypertensive retinopathy, bilateral: Secondary | ICD-10-CM | POA: Diagnosis not present

## 2023-08-15 DIAGNOSIS — H524 Presbyopia: Secondary | ICD-10-CM | POA: Diagnosis not present

## 2023-09-14 DIAGNOSIS — I10 Essential (primary) hypertension: Secondary | ICD-10-CM | POA: Diagnosis not present

## 2023-09-14 DIAGNOSIS — Z299 Encounter for prophylactic measures, unspecified: Secondary | ICD-10-CM | POA: Diagnosis not present

## 2023-09-14 DIAGNOSIS — L84 Corns and callosities: Secondary | ICD-10-CM | POA: Diagnosis not present

## 2023-09-14 DIAGNOSIS — E1169 Type 2 diabetes mellitus with other specified complication: Secondary | ICD-10-CM | POA: Diagnosis not present

## 2023-09-23 DIAGNOSIS — R319 Hematuria, unspecified: Secondary | ICD-10-CM | POA: Diagnosis not present

## 2023-09-23 DIAGNOSIS — E1129 Type 2 diabetes mellitus with other diabetic kidney complication: Secondary | ICD-10-CM | POA: Diagnosis not present

## 2023-09-23 DIAGNOSIS — I10 Essential (primary) hypertension: Secondary | ICD-10-CM | POA: Diagnosis not present

## 2023-09-23 DIAGNOSIS — E1169 Type 2 diabetes mellitus with other specified complication: Secondary | ICD-10-CM | POA: Diagnosis not present

## 2023-09-23 DIAGNOSIS — R809 Proteinuria, unspecified: Secondary | ICD-10-CM | POA: Diagnosis not present

## 2023-09-23 DIAGNOSIS — R52 Pain, unspecified: Secondary | ICD-10-CM | POA: Diagnosis not present

## 2023-09-23 DIAGNOSIS — R300 Dysuria: Secondary | ICD-10-CM | POA: Diagnosis not present

## 2023-09-23 DIAGNOSIS — Z299 Encounter for prophylactic measures, unspecified: Secondary | ICD-10-CM | POA: Diagnosis not present

## 2023-09-27 DIAGNOSIS — R52 Pain, unspecified: Secondary | ICD-10-CM | POA: Diagnosis not present

## 2023-09-27 DIAGNOSIS — N281 Cyst of kidney, acquired: Secondary | ICD-10-CM | POA: Diagnosis not present

## 2023-09-27 DIAGNOSIS — E1169 Type 2 diabetes mellitus with other specified complication: Secondary | ICD-10-CM | POA: Diagnosis not present

## 2023-09-27 DIAGNOSIS — N200 Calculus of kidney: Secondary | ICD-10-CM | POA: Diagnosis not present

## 2023-09-27 DIAGNOSIS — R109 Unspecified abdominal pain: Secondary | ICD-10-CM | POA: Diagnosis not present

## 2023-09-27 DIAGNOSIS — Z299 Encounter for prophylactic measures, unspecified: Secondary | ICD-10-CM | POA: Diagnosis not present

## 2023-09-27 DIAGNOSIS — N2889 Other specified disorders of kidney and ureter: Secondary | ICD-10-CM | POA: Diagnosis not present

## 2023-09-27 DIAGNOSIS — R5383 Other fatigue: Secondary | ICD-10-CM | POA: Diagnosis not present

## 2023-09-27 DIAGNOSIS — M549 Dorsalgia, unspecified: Secondary | ICD-10-CM | POA: Diagnosis not present

## 2023-09-27 DIAGNOSIS — I10 Essential (primary) hypertension: Secondary | ICD-10-CM | POA: Diagnosis not present

## 2023-09-27 DIAGNOSIS — N261 Atrophy of kidney (terminal): Secondary | ICD-10-CM | POA: Diagnosis not present

## 2023-09-27 DIAGNOSIS — N210 Calculus in bladder: Secondary | ICD-10-CM | POA: Diagnosis not present

## 2023-09-27 DIAGNOSIS — R319 Hematuria, unspecified: Secondary | ICD-10-CM | POA: Diagnosis not present

## 2023-10-02 ENCOUNTER — Other Ambulatory Visit: Payer: Self-pay | Admitting: Urology

## 2023-10-02 DIAGNOSIS — C61 Malignant neoplasm of prostate: Secondary | ICD-10-CM

## 2023-10-19 DIAGNOSIS — Z299 Encounter for prophylactic measures, unspecified: Secondary | ICD-10-CM | POA: Diagnosis not present

## 2023-10-19 DIAGNOSIS — Z1389 Encounter for screening for other disorder: Secondary | ICD-10-CM | POA: Diagnosis not present

## 2023-10-19 DIAGNOSIS — E7800 Pure hypercholesterolemia, unspecified: Secondary | ICD-10-CM | POA: Diagnosis not present

## 2023-10-19 DIAGNOSIS — R5383 Other fatigue: Secondary | ICD-10-CM | POA: Diagnosis not present

## 2023-10-19 DIAGNOSIS — I10 Essential (primary) hypertension: Secondary | ICD-10-CM | POA: Diagnosis not present

## 2023-10-19 DIAGNOSIS — Z79899 Other long term (current) drug therapy: Secondary | ICD-10-CM | POA: Diagnosis not present

## 2023-10-19 DIAGNOSIS — Z0000 Encounter for general adult medical examination without abnormal findings: Secondary | ICD-10-CM | POA: Diagnosis not present

## 2023-10-19 DIAGNOSIS — Z7189 Other specified counseling: Secondary | ICD-10-CM | POA: Diagnosis not present

## 2023-10-21 DIAGNOSIS — E7800 Pure hypercholesterolemia, unspecified: Secondary | ICD-10-CM | POA: Diagnosis not present

## 2023-10-21 DIAGNOSIS — R5383 Other fatigue: Secondary | ICD-10-CM | POA: Diagnosis not present

## 2023-10-21 DIAGNOSIS — Z79899 Other long term (current) drug therapy: Secondary | ICD-10-CM | POA: Diagnosis not present

## 2023-11-02 DIAGNOSIS — E119 Type 2 diabetes mellitus without complications: Secondary | ICD-10-CM | POA: Diagnosis not present

## 2023-12-16 DIAGNOSIS — Z0000 Encounter for general adult medical examination without abnormal findings: Secondary | ICD-10-CM | POA: Diagnosis not present

## 2023-12-16 DIAGNOSIS — I10 Essential (primary) hypertension: Secondary | ICD-10-CM | POA: Diagnosis not present

## 2023-12-16 DIAGNOSIS — E119 Type 2 diabetes mellitus without complications: Secondary | ICD-10-CM | POA: Diagnosis not present

## 2023-12-16 DIAGNOSIS — I700 Atherosclerosis of aorta: Secondary | ICD-10-CM | POA: Diagnosis not present

## 2023-12-16 DIAGNOSIS — Z299 Encounter for prophylactic measures, unspecified: Secondary | ICD-10-CM | POA: Diagnosis not present

## 2024-02-29 DIAGNOSIS — Z23 Encounter for immunization: Secondary | ICD-10-CM | POA: Diagnosis not present

## 2024-03-12 NOTE — Progress Notes (Signed)
 Impression/Assessment:  Persistent hematuria with negative evaluation, most likely of prostate source.  I this has decreased since he has been on finasteride   GG 1 prostate cancer, on active surveillance.  PSA bumped 6 months ago  Large left lower pole stone, no need for treatment    Plan:  Continue finasteride   PSA is checked today  I will see back in 1 year for recheck    History of Present Illness:   89 year old male comes in today for follow-up of recent urinary issues.  He has been followed here long-term.   PCa:   He underwent TRUS/Bx by Dr. Lupita in [place], [place]  in early November, 2012. At that time, he had a small left sided prostate nodule and a PSA of 5.4. 1/12 biopsies were positive for adenocarcinoma --the right apical lateral biopsy revealed GS 3+3 in a small focus. The other 11 biopsies were all negative. He decided on active surveillance, and had a repeat surveillance biopsy in May, 2014. This revealed one core, on the right mid lateral prostate, with a GS 3+3 in 5% of the core. All other biopsies were benign. Prostatic volume was 65 cc.    8.6.2019: PSA 6.8 in February 2019.    2.6.2020: MRI of prostate. Volume was 66 mL. TUR defect was present. PI-RADS category 3 lesion in right lateral mid/apical prostate.    3.23.2020: Underwent fusion biopsy. All 3 cores from ROI were benign. 2/12 routine cores revealed GS 3+3 pattern--both with 5% of cores involved.    9.8.2020:  PSA 6.7   3.16.2021:  PSA 7. Within the last few weeks he was diagnosed with colon cancer -- this is pending treatment. This was originally found after a single episode of blood per stool. He has been evaluated per CT scan (3.5.2021 -- records available).    9.21.2021: PSA 9.8. 1.4.2022: PSA 7.9 5.3.2022: PSA 8.3 11.8.2022:PSA 10.7 5.16.2023: PSA 10.7 4.30.2024: PSA 12.5       Urolithiasis:   He also has a history of urolithiasis.  Most recent imaging 1.11.2022---CT stone  protocol--Stone in left lower pole appeared essentially the same size as several years before on prior CT. IMPRESSION: 1. Stable staghorn type calculi involving the lower pole region of the left kidney and 7 mm calculus in the midpole region of the right kidney. No obstructing ureteral calculi or bladder calculi. 2. Stable large upper pole left renal cyst. 3. No acute abdominal/pelvic findings, mass lesions or adenopathy. 4. Enlarged prostate gland with median lobe hypertrophy impressing on the base of the bladder. 5. Advanced atherosclerotic calcifications involving the aorta and iliac arteries. 6. Aortic atherosclerosis.   10.29.2024: He comes in today for routine check.  He denies any gross hematuria.  He has been on finasteride .  Most recent hemoglobin A1c was 6.1.  Biggest urinary issue is nocturia x 2-3 which he chalked up to his diabetes.  He has had no left flank pain.  No recent PSA.  Past Medical History:  Diagnosis Date   Cancer [facility])    prostate cancer   Diabetes mellitus without complication (HCC)    History of kidney stones    Hypertension     Past Surgical History:  Procedure Laterality Date   BIOPSY  09/24/2021   Procedure: BIOPSY;  Surgeon: Wilhelmenia Aloha Raddle., MD;  Location: [facility] ENDOSCOPY;  Service: Gastroenterology;;   CATARACT EXTRACTION W/PHACO Right 02/21/2017   Procedure: CATARACT EXTRACTION PHACO AND INTRAOCULAR LENS PLACEMENT (IOC);  Surgeon: Perley Hamilton, MD;  Location: [facility]  ORS;  Service: Ophthalmology;  Laterality: Right;  CDE: 10.14   CATARACT EXTRACTION W/PHACO Left 03/28/2017   Procedure: CATARACT EXTRACTION PHACO AND INTRAOCULAR LENS PLACEMENT (IOC);  Surgeon: Perley Hamilton, MD;  Location: [facility] ORS;  Service: Ophthalmology;  Laterality: Left;  CDE: 8.57   COLON RESECTION  2021   COLONOSCOPY WITH PROPOFOL  N/A 09/24/2021   Procedure: COLONOSCOPY WITH PROPOFOL ;  Surgeon: Mansouraty, Aloha Raddle., MD;  Location: [facility] ENDOSCOPY;  Service: Gastroenterology;   Laterality: N/A;   ENDOSCOPIC MUCOSAL RESECTION N/A 09/24/2021   Procedure: ENDOSCOPIC MUCOSAL RESECTION;  Surgeon: Wilhelmenia Aloha Raddle., MD;  Location: [facility] ENDOSCOPY;  Service: Gastroenterology;  Laterality: N/A;   ESOPHAGOGASTRODUODENOSCOPY (EGD) WITH PROPOFOL  N/A 09/24/2021   Procedure: ESOPHAGOGASTRODUODENOSCOPY (EGD) WITH PROPOFOL ;  Surgeon: Wilhelmenia Aloha Raddle., MD;  Location: [facility] ENDOSCOPY;  Service: Gastroenterology;  Laterality: N/A;   FOREIGN BODY REMOVAL  09/24/2021   Procedure: FOREIGN BODY REMOVAL;  Surgeon: Wilhelmenia Aloha Raddle., MD;  Location: [facility] ENDOSCOPY;  Service: Gastroenterology;;   HEMOSTASIS CLIP PLACEMENT  09/24/2021   Procedure: HEMOSTASIS CLIP PLACEMENT;  Surgeon: Wilhelmenia Aloha Raddle., MD;  Location: [facility] ENDOSCOPY;  Service: Gastroenterology;;   LITHOTRIPSY     x3   POLYPECTOMY  09/24/2021   Procedure: POLYPECTOMY;  Surgeon: Wilhelmenia Aloha Raddle., MD;  Location: [facility] ENDOSCOPY;  Service: Gastroenterology;;   SUBMUCOSAL LIFTING INJECTION  09/24/2021   Procedure: SUBMUCOSAL LIFTING INJECTION;  Surgeon: Wilhelmenia Aloha Raddle., MD;  Location: [facility] ENDOSCOPY;  Service: Gastroenterology;;    Home Medications:  Allergies as of 03/13/2024   No Known Allergies      Medication List        Accurate as of March 12, 2024  1:13 PM. If you have any questions, ask your nurse or doctor.          ferrous gluconate  324 MG tablet Commonly known as: FERGON Take 1 tablet (324 mg total) by mouth daily with breakfast.   finasteride  5 MG tablet Commonly known as: PROSCAR  TAKE 1 TABLET(5 MG) BY MOUTH DAILY   losartan 25 MG tablet Commonly known as: COZAAR Take 25 mg by mouth daily.   metFORMIN 500 MG tablet Commonly known as: GLUCOPHAGE Take 500 mg by mouth 2 (two) times daily with a meal.   NIFEdipine 30 MG 24 hr tablet Commonly known as: PROCARDIA-XL/NIFEDICAL-XL Take 30 mg by mouth daily.   NIFEdipine 20 MG capsule Commonly known as: PROCARDIA 1 capsule as  needed   ondansetron  4 MG tablet Commonly known as: ZOFRAN  Take 1 tablet by mouth 30 mins before starting preparation for colonoscopy.        Allergies: No Known Allergies  Family History  Problem Relation Age of Onset   Cancer - Lung Brother        2 brothers   Prostate cancer Brother    Colon cancer Neg Hx    Stomach cancer Neg Hx    Esophageal cancer Neg Hx    Inflammatory bowel disease Neg Hx    Liver disease Neg Hx    Pancreatic cancer Neg Hx    Rectal cancer Neg Hx     Social History:  reports that he quit smoking about 65 years ago. His smoking use included cigarettes. He started smoking about 77 years ago. He has a 6 pack-year smoking history. He has never used smokeless tobacco. He reports that he does not drink alcohol and does not use drugs.  ROS: A complete review of systems was performed.  All systems are negative except for pertinent findings as  noted.  Physical Exam:  Vital signs in last 24 hours: There were no vitals taken for this visit. Constitutional:  Alert and oriented, No acute distress Cardiovascular: Regular rate  Respiratory: Normal respiratory effort Neurologic: Grossly intact, no focal deficits Psychiatric: Normal mood and affect  I have reviewed prior pt notes  I have reviewed urinalysis results-still with microscopic hematuria  I have independently reviewed prior imaging-CT images as well as MRI results reviewed  I have reviewed prior PSA results

## 2024-03-13 ENCOUNTER — Ambulatory Visit: Payer: Medicare Other | Admitting: Urology

## 2024-03-13 VITALS — BP 154/64 | HR 69

## 2024-03-13 DIAGNOSIS — N200 Calculus of kidney: Secondary | ICD-10-CM

## 2024-03-13 DIAGNOSIS — N402 Nodular prostate without lower urinary tract symptoms: Secondary | ICD-10-CM

## 2024-03-13 DIAGNOSIS — C61 Malignant neoplasm of prostate: Secondary | ICD-10-CM | POA: Diagnosis not present

## 2024-03-13 DIAGNOSIS — N401 Enlarged prostate with lower urinary tract symptoms: Secondary | ICD-10-CM

## 2024-03-14 LAB — URINALYSIS, ROUTINE W REFLEX MICROSCOPIC
Bilirubin, UA: NEGATIVE
Glucose, UA: NEGATIVE
Ketones, UA: NEGATIVE
Leukocytes,UA: NEGATIVE
Nitrite, UA: NEGATIVE
Specific Gravity, UA: 1.015 (ref 1.005–1.030)
Urobilinogen, Ur: 0.2 mg/dL (ref 0.2–1.0)
pH, UA: 7 (ref 5.0–7.5)

## 2024-03-14 LAB — PSA: Prostate Specific Ag, Serum: 27.2 ng/mL — ABNORMAL HIGH (ref 0.0–4.0)

## 2024-03-14 LAB — MICROSCOPIC EXAMINATION
Bacteria, UA: NONE SEEN
WBC, UA: NONE SEEN /HPF (ref 0–5)

## 2024-03-20 ENCOUNTER — Ambulatory Visit: Payer: Self-pay | Admitting: Urology

## 2024-03-20 ENCOUNTER — Other Ambulatory Visit: Payer: Self-pay | Admitting: Urology

## 2024-03-20 DIAGNOSIS — C61 Malignant neoplasm of prostate: Secondary | ICD-10-CM

## 2024-03-20 MED ORDER — LEVOFLOXACIN 750 MG PO TABS: 750.0000 mg | ORAL_TABLET | Freq: Every day | ORAL | 0 refills | Status: AC

## 2024-03-20 NOTE — Telephone Encounter (Signed)
-----   Message from Garnette HERO Dahlstedt sent at 03/20/2024 12:30 PM EST ----- I called patient with results of his PSA.  I have recommended repeat biopsy.  I put orders in.  Please set up. ----- Message ----- From: Rebecka Memos Lab Results In Sent: 03/14/2024   5:38 AM EST To: Garnette Shack, MD

## 2024-03-20 NOTE — Telephone Encounter (Signed)
 Called pt to scheduled him for prostate bx pt confirmed date of 12/02 and confirmed address for bx instructions to be mailed

## 2024-03-22 ENCOUNTER — Telehealth: Payer: Self-pay

## 2024-03-22 NOTE — Telephone Encounter (Signed)
 Patient called to inquire on biopsy date and time. Patient made aware.

## 2024-04-16 NOTE — Procedures (Signed)
 TRANSRECTAL ULTRASOUND AND PROSTATE BIOPSY  Indication:  Prostate cancer  Prophylactic antibiotic administration: Gentamicin , levaquin    All medications that could result in increased bleeding were discontinued within an appropriate period of the time of biopsy.  Risk including bleeding and infection were discussed.  Informed consent was obtained.  The patient was placed in the left lateral decubitus position.  PROCEDURE 1.  TRANSRECTAL ULTRASOUND OF THE PROSTATE  The 7 MHz transrectal probe was used to image the prostate.  Anal stenosis {WAS/WAS [phone]::was not} noted.  TRUS volume: *** ml  Hypoechoic areas: {prostate anatomy:26400}  Hyperechoic areas: {prostate anatomy:26400}  Central calcifications: {DESC; PRESENT/NOT PRESENT:21021351}  Margins:  {Normal/Abnormal Appearance:21344::normal}  Seminal Vesicles: {Normal/Abnormal Appearance:21344::normal}   PROCEDURE 2:  PROSTATE BIOPSY  A periprostatic block was performed using 1% lidocaine  and transrectal ultrasound guidance. Under transrectal ultrasound guidance, and using the Biopty gun, prostate biopsies were obtained systematically from the apex, mid gland, and base bilaterally.  A total of *** cores were obtained.  Hemostasis was obtained with gentle pressure on the prostate.  The procedures were well-tolerated.  No significant bleeding was noted at the end of the procedure.  The patient was stable for discharge from the office.

## 2024-04-17 ENCOUNTER — Ambulatory Visit (HOSPITAL_COMMUNITY)
Admission: RE | Admit: 2024-04-17 | Discharge: 2024-04-17 | Disposition: A | Source: Ambulatory Visit | Attending: Urology | Admitting: Urology

## 2024-04-17 ENCOUNTER — Encounter (HOSPITAL_COMMUNITY): Payer: Self-pay

## 2024-04-17 ENCOUNTER — Other Ambulatory Visit: Payer: Self-pay | Admitting: Urology

## 2024-04-17 ENCOUNTER — Ambulatory Visit: Admitting: Urology

## 2024-04-17 DIAGNOSIS — C61 Malignant neoplasm of prostate: Secondary | ICD-10-CM

## 2024-04-17 MED ORDER — LIDOCAINE HCL (PF) 2 % IJ SOLN
INTRAMUSCULAR | Status: AC
  Filled 2024-04-17: qty 10

## 2024-04-17 MED ORDER — LIDOCAINE HCL (PF) 2 % IJ SOLN
10.0000 mL | Freq: Once | INTRAMUSCULAR | Status: AC
  Administered 2024-04-17: 10 mL

## 2024-04-17 MED ORDER — GENTAMICIN SULFATE 40 MG/ML IJ SOLN
160.0000 mg | Freq: Once | INTRAMUSCULAR | Status: AC
  Administered 2024-04-17: 160 mg via INTRAMUSCULAR

## 2024-04-17 MED ORDER — GENTAMICIN SULFATE 40 MG/ML IJ SOLN
INTRAMUSCULAR | Status: AC
  Filled 2024-04-17: qty 4

## 2024-04-17 NOTE — Progress Notes (Signed)
 PT tolerated prostate biopsy procedure and antibiotic injection well today. Labs obtained and sent for pathology by Colette from ultrasound. PT ambulatory at discharge with no acute distress noted and verbalized understanding of discharge instructions.

## 2024-04-18 LAB — SURGICAL PATHOLOGY

## 2024-04-24 ENCOUNTER — Other Ambulatory Visit: Payer: Self-pay | Admitting: Urology

## 2024-04-24 ENCOUNTER — Ambulatory Visit: Payer: Self-pay | Admitting: Urology

## 2024-04-24 DIAGNOSIS — C61 Malignant neoplasm of prostate: Secondary | ICD-10-CM

## 2024-05-01 ENCOUNTER — Telehealth: Payer: Self-pay

## 2024-05-01 NOTE — Telephone Encounter (Signed)
 Pt called to ask when his PET scan will be cheduled pt advised once authorized by insurance someone will reach out to schedule pt voiced his understanding

## 2024-05-02 NOTE — Telephone Encounter (Signed)
 Pet scan denied by insurance. In basket message sent to Dr. Matilda in the referral on next steps.

## 2024-05-24 NOTE — Addendum Note (Signed)
 Addended by: MALACHY SLICE on: 05/24/2024 11:45 AM   Modules accepted: Orders

## 2024-06-07 ENCOUNTER — Encounter (HOSPITAL_COMMUNITY)
Admission: RE | Admit: 2024-06-07 | Discharge: 2024-06-07 | Disposition: A | Discharge status: Home / Self Care | Source: Ambulatory Visit | Attending: Urology | Admitting: Urology

## 2024-06-07 DIAGNOSIS — C61 Malignant neoplasm of prostate: Secondary | ICD-10-CM | POA: Diagnosis present

## 2024-06-07 MED ORDER — FLOTUFOLASTAT F 18 GALLIUM 296-5846 MBQ/ML IV SOLN
8.0000 | Freq: Once | INTRAVENOUS | Status: AC
  Administered 2024-06-07: 8 via INTRAVENOUS

## 2024-06-12 ENCOUNTER — Other Ambulatory Visit: Payer: Self-pay | Admitting: Urology

## 2024-06-12 DIAGNOSIS — C61 Malignant neoplasm of prostate: Secondary | ICD-10-CM

## 2024-06-12 MED ORDER — ORGOVYX 120 MG PO TABS: 120.0000 mg | ORAL_TABLET | Freq: Every day | ORAL | 11 refills | Status: AC

## 2024-06-12 NOTE — Addendum Note (Signed)
 Addended by: SAMMIE EXIE HERO on: 06/12/2024 03:57 PM   Modules accepted: Orders

## 2024-06-13 ENCOUNTER — Ambulatory Visit: Payer: Self-pay

## 2024-06-13 NOTE — Telephone Encounter (Signed)
-----   Message from Garnette Shack, MD sent at 06/12/2024 12:33 PM EST ----- I called patient with his PET scan results.  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  I would like him to start on Orgovyx , see if we can have him started on samples from here in the office  this week.  I also put a order in for him to see referral with cancer doctor here in [facility].

## 2024-06-13 NOTE — Telephone Encounter (Signed)
 Called patient to ask him to come by our office to get loading dose of orgovyx  as well as sign the support program paper work patient wife stated due to the weather they are unable to get out on today but will attempt to come either Thursday or Friday

## 2024-06-14 ENCOUNTER — Other Ambulatory Visit: Payer: Self-pay

## 2024-06-14 ENCOUNTER — Telehealth: Payer: Self-pay

## 2024-06-14 NOTE — Progress Notes (Signed)
 Pharmacy Patient Advocate Encounter  Insurance verification completed.   The patient is insured through [employer] claim for Orgovyx . PA required.  This test claim was processed through [facility]- copay amounts may vary at other pharmacies due to pharmacy/plan contracts, or as the patient moves through the different stages of their insurance plan.

## 2024-06-14 NOTE — Telephone Encounter (Signed)
 Pharmacy Patient Advocate Encounter   Received notification from Pt Calls Messages that prior authorization for Orgovyx  is required/requested.   Insurance verification completed.   The patient is insured through [employer].   Per test claim: PA required; PA submitted to above mentioned insurance via Latent Key/confirmation #/[id number] Status is pending

## 2024-06-15 ENCOUNTER — Other Ambulatory Visit: Payer: Self-pay

## 2024-06-15 NOTE — Telephone Encounter (Signed)
 Pharmacy Patient Advocate Encounter  Received notification from [facility] that Prior Authorization for Orgovyx  has been APPROVED from 06/15/24 to 05/16/25   PA #/Case ID/Reference #: [id number]

## 2024-06-15 NOTE — Progress Notes (Signed)
 PA approved  Pharmacy Patient Advocate Encounter  Insurance verification completed.   The patient is insured through [employer] test claim for Orgovyx . Currently a quantity of 30 is a 30 day supply and the co-pay is $1,143.56. Patient is eligible for the medicare payment plan  This test claim was processed through [facility]- copay amounts may vary at other pharmacies due to pharmacy/plan contracts, or as the patient moves through the different stages of their insurance plan.

## 2024-06-18 ENCOUNTER — Other Ambulatory Visit: Payer: Self-pay

## 2024-06-18 ENCOUNTER — Inpatient Hospital Stay: Admitting: Oncology

## 2024-06-18 ENCOUNTER — Inpatient Hospital Stay

## 2024-06-19 ENCOUNTER — Other Ambulatory Visit: Payer: Self-pay

## 2024-06-26 ENCOUNTER — Inpatient Hospital Stay

## 2024-06-26 ENCOUNTER — Inpatient Hospital Stay: Admitting: Oncology

## 2025-03-13 ENCOUNTER — Ambulatory Visit: Admitting: Urology
